# Patient Record
Sex: Female | Born: 1960
Health system: Southern US, Community
[De-identification: ages and names within clinical notes are randomized; demographics above are authoritative.]

## PROBLEM LIST (undated history)

## (undated) DIAGNOSIS — I10 Essential (primary) hypertension: Secondary | ICD-10-CM

## (undated) HISTORY — PX: BREAST CYST ASPIRATION: SHX578

---

## 2002-12-17 ENCOUNTER — Other Ambulatory Visit: Payer: Self-pay

## 2006-02-04 ENCOUNTER — Emergency Department: Payer: Self-pay | Admitting: Emergency Medicine

## 2006-02-07 ENCOUNTER — Ambulatory Visit: Payer: Self-pay | Admitting: Emergency Medicine

## 2006-10-07 ENCOUNTER — Emergency Department: Payer: Self-pay | Admitting: Emergency Medicine

## 2006-10-07 ENCOUNTER — Other Ambulatory Visit: Payer: Self-pay

## 2007-06-01 ENCOUNTER — Ambulatory Visit: Payer: Self-pay | Admitting: Family Medicine

## 2007-06-22 ENCOUNTER — Ambulatory Visit: Payer: Self-pay | Admitting: Family Medicine

## 2007-08-23 ENCOUNTER — Ambulatory Visit: Payer: Self-pay | Admitting: Family Medicine

## 2007-08-30 ENCOUNTER — Ambulatory Visit: Payer: Self-pay | Admitting: Surgery

## 2007-08-31 ENCOUNTER — Ambulatory Visit: Payer: Self-pay | Admitting: Surgery

## 2008-08-20 ENCOUNTER — Ambulatory Visit: Payer: Self-pay | Admitting: Family Medicine

## 2008-12-04 ENCOUNTER — Ambulatory Visit: Payer: Self-pay | Admitting: Family Medicine

## 2009-02-03 ENCOUNTER — Ambulatory Visit: Payer: Self-pay | Admitting: Gastroenterology

## 2009-09-01 ENCOUNTER — Ambulatory Visit: Payer: Self-pay | Admitting: Unknown Physician Specialty

## 2010-05-06 ENCOUNTER — Ambulatory Visit: Payer: Self-pay | Admitting: Obstetrics and Gynecology

## 2010-05-11 ENCOUNTER — Ambulatory Visit: Payer: Self-pay | Admitting: Obstetrics and Gynecology

## 2010-09-14 ENCOUNTER — Ambulatory Visit: Payer: Self-pay | Admitting: Unknown Physician Specialty

## 2011-11-02 ENCOUNTER — Ambulatory Visit: Payer: Self-pay | Admitting: Internal Medicine

## 2012-01-04 ENCOUNTER — Ambulatory Visit: Payer: Self-pay | Admitting: Physician Assistant

## 2012-02-07 ENCOUNTER — Ambulatory Visit: Payer: Self-pay | Admitting: Internal Medicine

## 2012-11-02 ENCOUNTER — Ambulatory Visit: Payer: Self-pay | Admitting: Internal Medicine

## 2012-11-20 ENCOUNTER — Ambulatory Visit: Payer: Self-pay | Admitting: Gastroenterology

## 2013-12-11 ENCOUNTER — Ambulatory Visit: Payer: Self-pay | Admitting: Internal Medicine

## 2013-12-19 ENCOUNTER — Ambulatory Visit: Payer: Self-pay | Admitting: Internal Medicine

## 2014-06-13 ENCOUNTER — Other Ambulatory Visit: Payer: Self-pay | Admitting: Internal Medicine

## 2014-06-13 DIAGNOSIS — R911 Solitary pulmonary nodule: Secondary | ICD-10-CM

## 2014-06-17 ENCOUNTER — Ambulatory Visit
Admission: RE | Admit: 2014-06-17 | Discharge: 2014-06-17 | Disposition: A | Discharge status: Home / Self Care | Payer: 59 | Source: Ambulatory Visit | Attending: Internal Medicine | Admitting: Internal Medicine

## 2014-06-17 DIAGNOSIS — J432 Centrilobular emphysema: Secondary | ICD-10-CM | POA: Insufficient documentation

## 2014-06-17 DIAGNOSIS — R911 Solitary pulmonary nodule: Secondary | ICD-10-CM | POA: Diagnosis present

## 2014-06-17 DIAGNOSIS — K760 Fatty (change of) liver, not elsewhere classified: Secondary | ICD-10-CM | POA: Insufficient documentation

## 2014-06-17 DIAGNOSIS — Z9049 Acquired absence of other specified parts of digestive tract: Secondary | ICD-10-CM | POA: Diagnosis not present

## 2014-06-17 DIAGNOSIS — I10 Essential (primary) hypertension: Secondary | ICD-10-CM | POA: Diagnosis not present

## 2014-06-17 DIAGNOSIS — K449 Diaphragmatic hernia without obstruction or gangrene: Secondary | ICD-10-CM | POA: Diagnosis not present

## 2014-06-17 HISTORY — DX: Essential (primary) hypertension: I10

## 2014-08-19 ENCOUNTER — Other Ambulatory Visit: Payer: Self-pay | Admitting: Internal Medicine

## 2014-08-19 DIAGNOSIS — R102 Pelvic and perineal pain: Secondary | ICD-10-CM

## 2014-08-19 DIAGNOSIS — R1030 Lower abdominal pain, unspecified: Secondary | ICD-10-CM

## 2014-08-22 ENCOUNTER — Ambulatory Visit
Admission: RE | Admit: 2014-08-22 | Discharge: 2014-08-22 | Disposition: A | Discharge status: Home / Self Care | Payer: 59 | Source: Ambulatory Visit | Attending: Internal Medicine | Admitting: Internal Medicine

## 2014-08-22 DIAGNOSIS — K760 Fatty (change of) liver, not elsewhere classified: Secondary | ICD-10-CM | POA: Insufficient documentation

## 2014-08-22 DIAGNOSIS — R102 Pelvic and perineal pain: Secondary | ICD-10-CM

## 2014-08-22 DIAGNOSIS — K5730 Diverticulosis of large intestine without perforation or abscess without bleeding: Secondary | ICD-10-CM | POA: Diagnosis not present

## 2014-08-22 DIAGNOSIS — N281 Cyst of kidney, acquired: Secondary | ICD-10-CM | POA: Insufficient documentation

## 2014-08-22 DIAGNOSIS — R1030 Lower abdominal pain, unspecified: Secondary | ICD-10-CM | POA: Diagnosis present

## 2014-08-22 DIAGNOSIS — N949 Unspecified condition associated with female genital organs and menstrual cycle: Secondary | ICD-10-CM | POA: Diagnosis present

## 2014-08-22 MED ORDER — IOHEXOL 300 MG/ML  SOLN
100.0000 mL | Freq: Once | INTRAMUSCULAR | Status: AC | PRN
  Administered 2014-08-22: 100 mL via INTRAVENOUS

## 2015-05-13 ENCOUNTER — Other Ambulatory Visit: Payer: Self-pay | Admitting: Internal Medicine

## 2015-05-13 DIAGNOSIS — Z1231 Encounter for screening mammogram for malignant neoplasm of breast: Secondary | ICD-10-CM

## 2015-05-15 ENCOUNTER — Ambulatory Visit: Payer: 59

## 2015-05-30 ENCOUNTER — Ambulatory Visit: Payer: 59

## 2015-06-02 ENCOUNTER — Ambulatory Visit
Admission: RE | Admit: 2015-06-02 | Discharge: 2015-06-02 | Disposition: A | Discharge status: Home / Self Care | Payer: 59 | Source: Ambulatory Visit | Attending: Internal Medicine | Admitting: Internal Medicine

## 2015-06-02 DIAGNOSIS — Z1231 Encounter for screening mammogram for malignant neoplasm of breast: Secondary | ICD-10-CM

## 2016-05-05 ENCOUNTER — Other Ambulatory Visit: Payer: Self-pay | Admitting: Internal Medicine

## 2016-05-05 DIAGNOSIS — Z1231 Encounter for screening mammogram for malignant neoplasm of breast: Secondary | ICD-10-CM

## 2016-05-18 DIAGNOSIS — R202 Paresthesia of skin: Secondary | ICD-10-CM | POA: Diagnosis not present

## 2016-05-18 DIAGNOSIS — Z1211 Encounter for screening for malignant neoplasm of colon: Secondary | ICD-10-CM | POA: Diagnosis not present

## 2016-05-18 DIAGNOSIS — Z124 Encounter for screening for malignant neoplasm of cervix: Secondary | ICD-10-CM | POA: Diagnosis not present

## 2016-05-18 DIAGNOSIS — Z1231 Encounter for screening mammogram for malignant neoplasm of breast: Secondary | ICD-10-CM | POA: Diagnosis not present

## 2016-05-18 DIAGNOSIS — E7800 Pure hypercholesterolemia, unspecified: Secondary | ICD-10-CM | POA: Diagnosis not present

## 2016-05-18 DIAGNOSIS — Z79899 Other long term (current) drug therapy: Secondary | ICD-10-CM | POA: Diagnosis not present

## 2016-05-18 DIAGNOSIS — Z01419 Encounter for gynecological examination (general) (routine) without abnormal findings: Secondary | ICD-10-CM | POA: Diagnosis not present

## 2016-05-18 DIAGNOSIS — I10 Essential (primary) hypertension: Secondary | ICD-10-CM | POA: Diagnosis not present

## 2016-06-25 ENCOUNTER — Ambulatory Visit: Payer: 59

## 2016-06-25 ENCOUNTER — Ambulatory Visit
Admission: RE | Admit: 2016-06-25 | Discharge: 2016-06-25 | Disposition: A | Discharge status: Home / Self Care | Payer: 59 | Source: Ambulatory Visit | Attending: Internal Medicine | Admitting: Internal Medicine

## 2016-06-25 DIAGNOSIS — Z1231 Encounter for screening mammogram for malignant neoplasm of breast: Secondary | ICD-10-CM | POA: Diagnosis not present

## 2017-02-04 DIAGNOSIS — N762 Acute vulvitis: Secondary | ICD-10-CM | POA: Diagnosis not present

## 2017-02-18 DIAGNOSIS — L280 Lichen simplex chronicus: Secondary | ICD-10-CM | POA: Diagnosis not present

## 2017-02-18 DIAGNOSIS — L821 Other seborrheic keratosis: Secondary | ICD-10-CM | POA: Diagnosis not present

## 2017-04-29 DIAGNOSIS — E7800 Pure hypercholesterolemia, unspecified: Secondary | ICD-10-CM | POA: Diagnosis not present

## 2017-04-29 DIAGNOSIS — I10 Essential (primary) hypertension: Secondary | ICD-10-CM | POA: Diagnosis not present

## 2017-06-10 DIAGNOSIS — Z79899 Other long term (current) drug therapy: Secondary | ICD-10-CM | POA: Diagnosis not present

## 2017-06-10 DIAGNOSIS — R7309 Other abnormal glucose: Secondary | ICD-10-CM | POA: Diagnosis not present

## 2017-06-10 DIAGNOSIS — I10 Essential (primary) hypertension: Secondary | ICD-10-CM | POA: Diagnosis not present

## 2017-06-10 DIAGNOSIS — E7800 Pure hypercholesterolemia, unspecified: Secondary | ICD-10-CM | POA: Diagnosis not present

## 2017-06-24 ENCOUNTER — Other Ambulatory Visit: Payer: Self-pay | Admitting: Internal Medicine

## 2017-06-24 DIAGNOSIS — Z1231 Encounter for screening mammogram for malignant neoplasm of breast: Secondary | ICD-10-CM

## 2017-07-26 ENCOUNTER — Ambulatory Visit
Admission: RE | Admit: 2017-07-26 | Discharge: 2017-07-26 | Disposition: A | Discharge status: Home / Self Care | Payer: 59 | Source: Ambulatory Visit | Attending: Internal Medicine | Admitting: Internal Medicine

## 2017-07-26 DIAGNOSIS — Z1231 Encounter for screening mammogram for malignant neoplasm of breast: Secondary | ICD-10-CM | POA: Diagnosis not present

## 2017-07-26 DIAGNOSIS — Z01419 Encounter for gynecological examination (general) (routine) without abnormal findings: Secondary | ICD-10-CM | POA: Diagnosis not present

## 2017-08-19 DIAGNOSIS — Z124 Encounter for screening for malignant neoplasm of cervix: Secondary | ICD-10-CM | POA: Diagnosis not present

## 2017-09-19 DIAGNOSIS — I10 Essential (primary) hypertension: Secondary | ICD-10-CM | POA: Diagnosis not present

## 2017-09-19 DIAGNOSIS — E049 Nontoxic goiter, unspecified: Secondary | ICD-10-CM | POA: Diagnosis not present

## 2017-09-19 DIAGNOSIS — Z0000 Encounter for general adult medical examination without abnormal findings: Secondary | ICD-10-CM | POA: Diagnosis not present

## 2017-09-30 DIAGNOSIS — M778 Other enthesopathies, not elsewhere classified: Secondary | ICD-10-CM | POA: Diagnosis not present

## 2017-09-30 DIAGNOSIS — M7712 Lateral epicondylitis, left elbow: Secondary | ICD-10-CM | POA: Diagnosis not present

## 2017-09-30 DIAGNOSIS — M25522 Pain in left elbow: Secondary | ICD-10-CM | POA: Diagnosis not present

## 2018-02-02 DIAGNOSIS — H9209 Otalgia, unspecified ear: Secondary | ICD-10-CM | POA: Diagnosis not present

## 2018-02-02 DIAGNOSIS — G501 Atypical facial pain: Secondary | ICD-10-CM | POA: Diagnosis not present

## 2018-03-15 DIAGNOSIS — Z79899 Other long term (current) drug therapy: Secondary | ICD-10-CM | POA: Diagnosis not present

## 2018-03-15 DIAGNOSIS — E7800 Pure hypercholesterolemia, unspecified: Secondary | ICD-10-CM | POA: Diagnosis not present

## 2018-03-15 DIAGNOSIS — R7309 Other abnormal glucose: Secondary | ICD-10-CM | POA: Diagnosis not present

## 2018-03-15 DIAGNOSIS — I10 Essential (primary) hypertension: Secondary | ICD-10-CM | POA: Diagnosis not present

## 2018-03-21 DIAGNOSIS — I10 Essential (primary) hypertension: Secondary | ICD-10-CM | POA: Diagnosis not present

## 2018-03-21 DIAGNOSIS — M2550 Pain in unspecified joint: Secondary | ICD-10-CM | POA: Diagnosis not present

## 2018-03-21 DIAGNOSIS — E7800 Pure hypercholesterolemia, unspecified: Secondary | ICD-10-CM | POA: Diagnosis not present

## 2018-07-20 ENCOUNTER — Other Ambulatory Visit: Payer: Self-pay | Admitting: Internal Medicine

## 2018-07-20 DIAGNOSIS — Z1231 Encounter for screening mammogram for malignant neoplasm of breast: Secondary | ICD-10-CM

## 2018-08-28 ENCOUNTER — Ambulatory Visit
Admission: RE | Admit: 2018-08-28 | Discharge: 2018-08-28 | Disposition: A | Discharge status: Home / Self Care | Payer: BC Managed Care – PPO | Source: Ambulatory Visit | Attending: Internal Medicine | Admitting: Internal Medicine

## 2018-08-28 DIAGNOSIS — Z1231 Encounter for screening mammogram for malignant neoplasm of breast: Secondary | ICD-10-CM | POA: Insufficient documentation

## 2018-08-31 ENCOUNTER — Other Ambulatory Visit: Payer: Self-pay | Admitting: Internal Medicine

## 2018-08-31 DIAGNOSIS — N6310 Unspecified lump in the right breast, unspecified quadrant: Secondary | ICD-10-CM

## 2018-08-31 DIAGNOSIS — R928 Other abnormal and inconclusive findings on diagnostic imaging of breast: Secondary | ICD-10-CM

## 2018-09-08 ENCOUNTER — Ambulatory Visit
Admission: RE | Admit: 2018-09-08 | Discharge: 2018-09-08 | Disposition: A | Discharge status: Home / Self Care | Payer: BC Managed Care – PPO | Source: Ambulatory Visit | Attending: Internal Medicine | Admitting: Internal Medicine

## 2018-09-08 ENCOUNTER — Other Ambulatory Visit: Payer: Self-pay

## 2018-09-08 DIAGNOSIS — N6310 Unspecified lump in the right breast, unspecified quadrant: Secondary | ICD-10-CM | POA: Insufficient documentation

## 2018-09-08 DIAGNOSIS — R928 Other abnormal and inconclusive findings on diagnostic imaging of breast: Secondary | ICD-10-CM | POA: Diagnosis not present

## 2019-02-23 ENCOUNTER — Other Ambulatory Visit: Payer: Self-pay | Admitting: Internal Medicine

## 2019-02-23 DIAGNOSIS — N6001 Solitary cyst of right breast: Secondary | ICD-10-CM

## 2019-03-15 ENCOUNTER — Ambulatory Visit
Admission: RE | Admit: 2019-03-15 | Discharge: 2019-03-15 | Disposition: A | Discharge status: Home / Self Care | Payer: BC Managed Care – PPO | Source: Ambulatory Visit | Attending: Internal Medicine | Admitting: Internal Medicine

## 2019-03-15 DIAGNOSIS — N6001 Solitary cyst of right breast: Secondary | ICD-10-CM | POA: Diagnosis not present

## 2019-03-16 ENCOUNTER — Other Ambulatory Visit: Payer: Self-pay | Admitting: Internal Medicine

## 2019-03-16 DIAGNOSIS — Z1231 Encounter for screening mammogram for malignant neoplasm of breast: Secondary | ICD-10-CM

## 2019-03-16 DIAGNOSIS — R928 Other abnormal and inconclusive findings on diagnostic imaging of breast: Secondary | ICD-10-CM

## 2019-03-16 DIAGNOSIS — N63 Unspecified lump in unspecified breast: Secondary | ICD-10-CM

## 2019-08-29 ENCOUNTER — Other Ambulatory Visit: Payer: BC Managed Care – PPO

## 2019-08-31 ENCOUNTER — Other Ambulatory Visit: Payer: Self-pay

## 2019-08-31 ENCOUNTER — Ambulatory Visit
Admission: RE | Admit: 2019-08-31 | Discharge: 2019-08-31 | Disposition: A | Discharge status: Home / Self Care | Payer: 59 | Source: Ambulatory Visit | Attending: Internal Medicine | Admitting: Internal Medicine

## 2019-08-31 DIAGNOSIS — N63 Unspecified lump in unspecified breast: Secondary | ICD-10-CM | POA: Diagnosis present

## 2019-08-31 DIAGNOSIS — Z1231 Encounter for screening mammogram for malignant neoplasm of breast: Secondary | ICD-10-CM

## 2019-08-31 DIAGNOSIS — R928 Other abnormal and inconclusive findings on diagnostic imaging of breast: Secondary | ICD-10-CM | POA: Diagnosis not present

## 2020-01-18 ENCOUNTER — Other Ambulatory Visit: Payer: Self-pay

## 2020-01-18 ENCOUNTER — Ambulatory Visit (LOCAL_COMMUNITY_HEALTH_CENTER): Payer: 59

## 2020-01-18 DIAGNOSIS — Z23 Encounter for immunization: Secondary | ICD-10-CM

## 2020-01-18 NOTE — Progress Notes (Signed)
Tdap given; tolerated well.Tressia Labrum, RN  

## 2020-06-05 ENCOUNTER — Other Ambulatory Visit: Payer: Self-pay | Admitting: Internal Medicine

## 2020-06-05 DIAGNOSIS — Z1231 Encounter for screening mammogram for malignant neoplasm of breast: Secondary | ICD-10-CM

## 2020-09-05 ENCOUNTER — Ambulatory Visit
Admission: RE | Admit: 2020-09-05 | Discharge: 2020-09-05 | Disposition: A | Discharge status: Home / Self Care | Payer: 59 | Source: Ambulatory Visit | Attending: Internal Medicine | Admitting: Internal Medicine

## 2020-09-05 ENCOUNTER — Other Ambulatory Visit: Payer: Self-pay

## 2020-09-05 DIAGNOSIS — Z1231 Encounter for screening mammogram for malignant neoplasm of breast: Secondary | ICD-10-CM | POA: Diagnosis present

## 2020-09-17 IMAGING — US US BREAST*R* LIMITED INC AXILLA
1 series · 11 of 11 positions shown · non-contrast
Comparison: Previous exam(s).

CLINICAL DATA: Six-month follow-up for a likely benign right breast
mass.

EXAM:
ULTRASOUND OF THE RIGHT BREAST

[Series 1: us breast*right* limited inc axilla · 0.05mm/px · 11 of 11 slices shown]
[im 1/11]
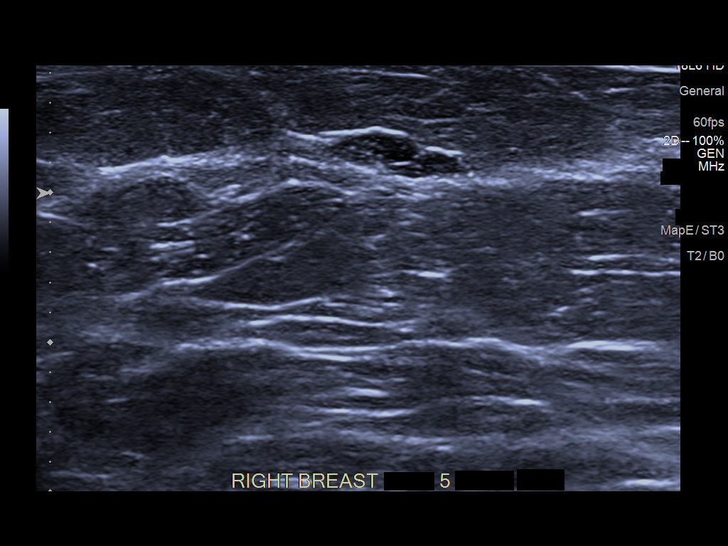
[im 2/11]
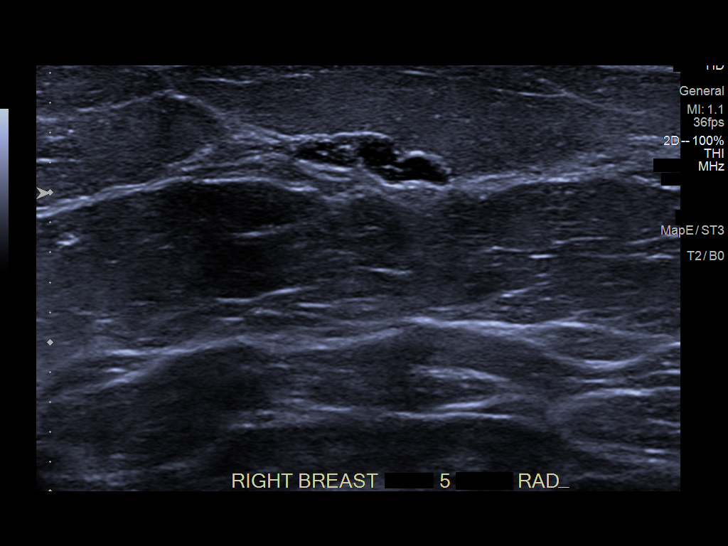
[im 3/11]
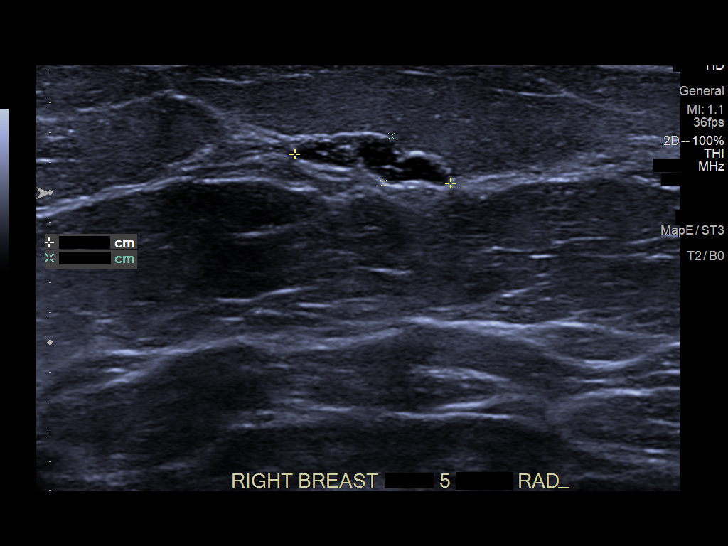
[im 4/11]
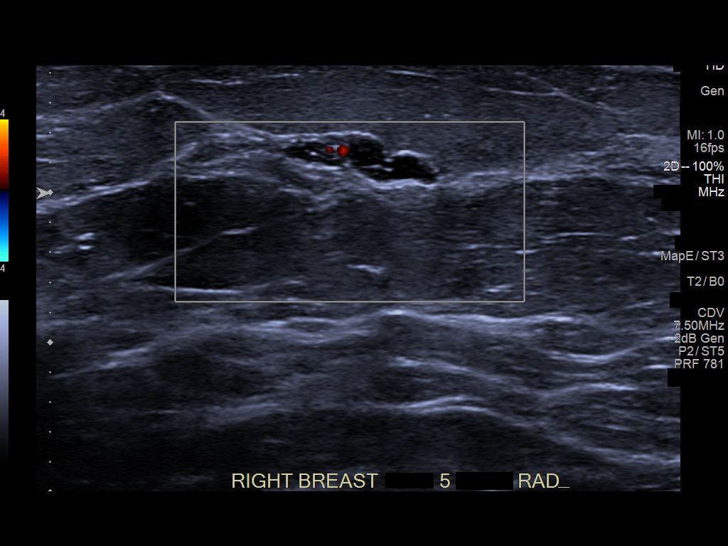
[im 5/11]
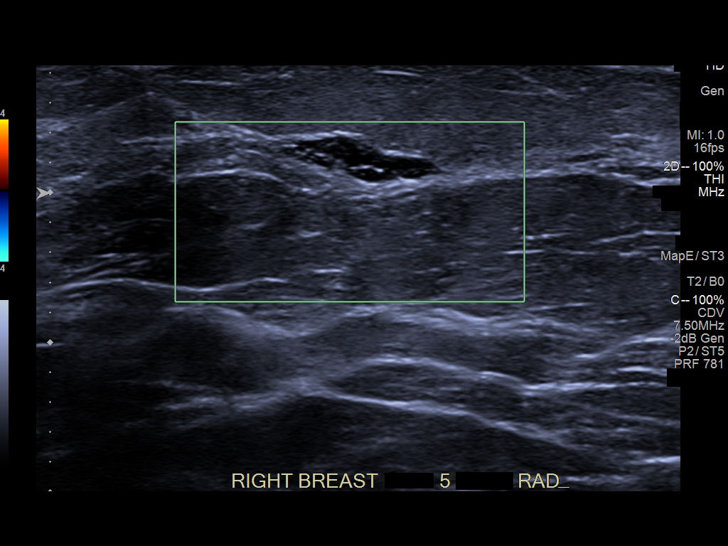
[im 6/11]
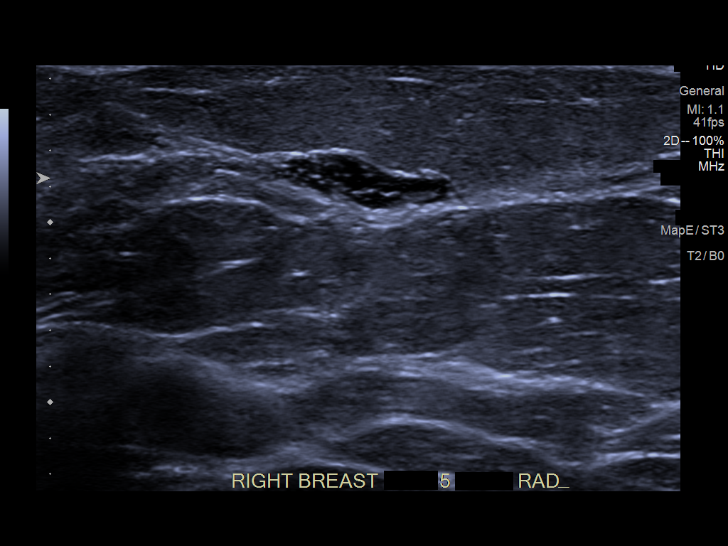
[im 7/11]
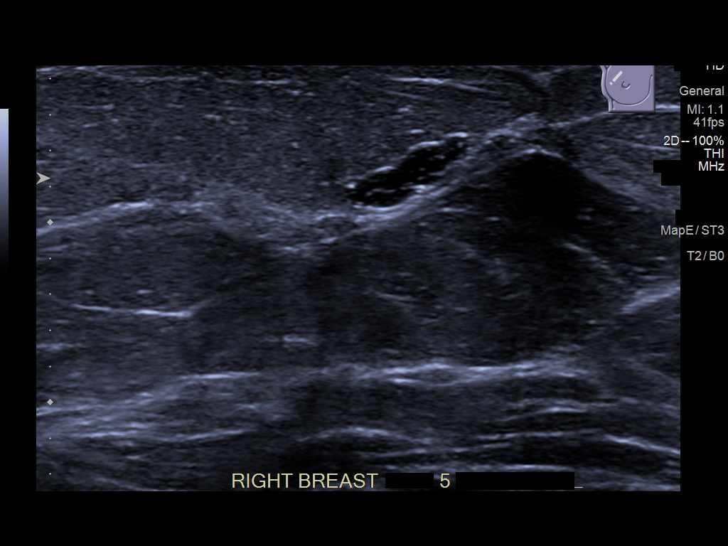
[im 8/11]
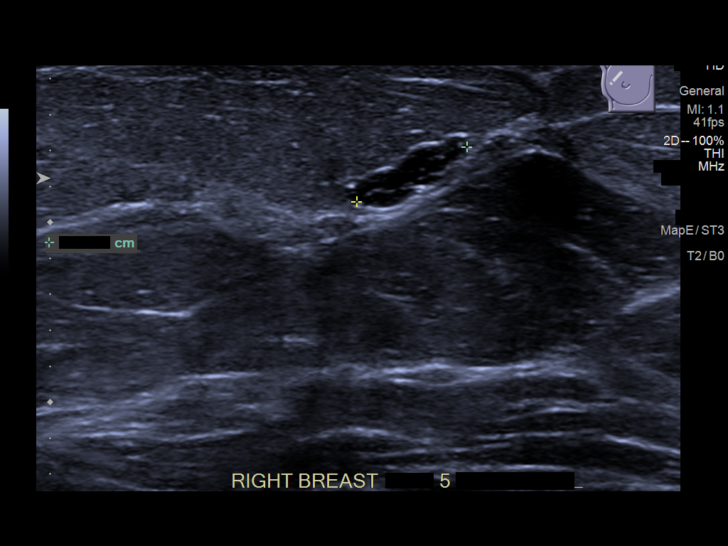
[im 9/11]
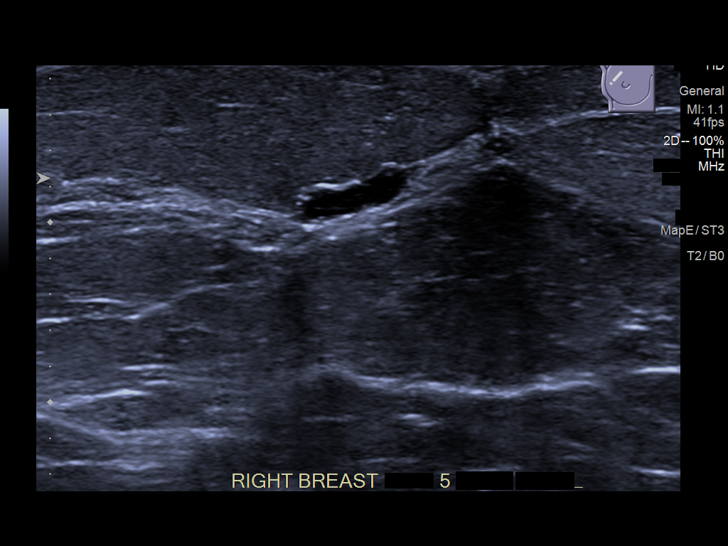
[im 10/11]
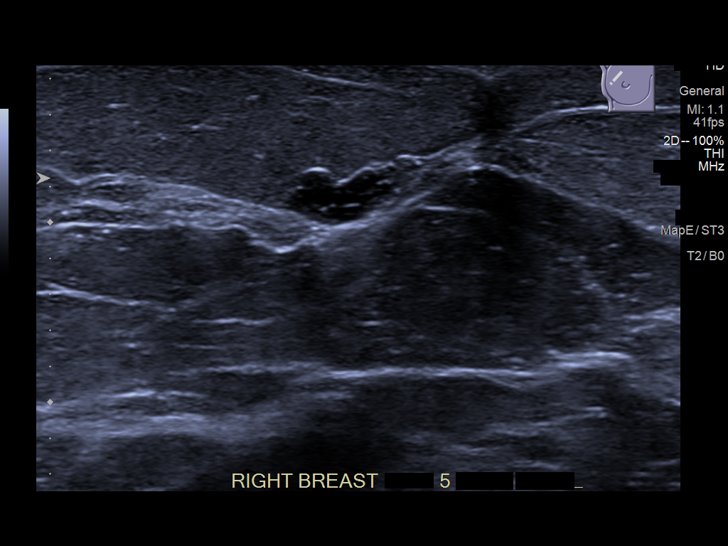
[im 11/11]
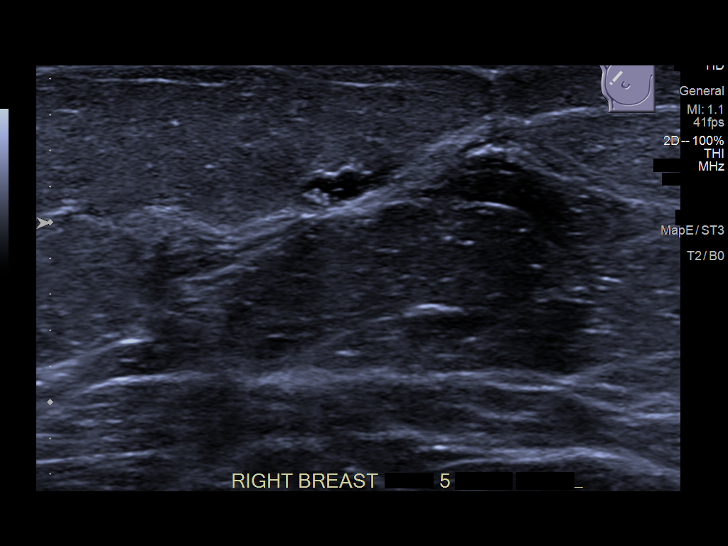

[11 of 11 positions shown; findings below may reference images not displayed]

FINDINGS: Ultrasound of the right breast at 10 30, 5 cm from the nipple
demonstrates a stable oval mass measuring 1.1 x 0.3 x 0.7 cm,
previously 1.2 x 0.3 x 0.7 cm.
IMPRESSION: The likely benign mass in the right breast at [DATE] is stable.

RECOMMENDATION:
Bilateral diagnostic mammogram and right breast ultrasound is
recommended in Friday August, 2019.

I have discussed the findings and recommendations with the patient.
If applicable, a reminder letter will be sent to the patient
regarding the next appointment.

BI-RADS CATEGORY  3: Probably benign.

## 2020-11-04 ENCOUNTER — Other Ambulatory Visit: Payer: Self-pay | Admitting: Gastroenterology

## 2020-11-04 ENCOUNTER — Other Ambulatory Visit (HOSPITAL_COMMUNITY): Payer: Self-pay | Admitting: Gastroenterology

## 2020-11-04 DIAGNOSIS — R1013 Epigastric pain: Secondary | ICD-10-CM

## 2020-11-13 ENCOUNTER — Other Ambulatory Visit: Payer: Self-pay

## 2020-11-13 ENCOUNTER — Ambulatory Visit
Admission: RE | Admit: 2020-11-13 | Discharge: 2020-11-13 | Disposition: A | Discharge status: Home / Self Care | Payer: 59 | Source: Ambulatory Visit | Attending: Gastroenterology | Admitting: Gastroenterology

## 2020-11-13 DIAGNOSIS — R1013 Epigastric pain: Secondary | ICD-10-CM | POA: Insufficient documentation

## 2021-06-23 ENCOUNTER — Other Ambulatory Visit: Payer: Self-pay | Admitting: Internal Medicine

## 2021-06-23 DIAGNOSIS — Z1231 Encounter for screening mammogram for malignant neoplasm of breast: Secondary | ICD-10-CM

## 2021-09-11 ENCOUNTER — Ambulatory Visit
Admission: RE | Admit: 2021-09-11 | Discharge: 2021-09-11 | Disposition: A | Discharge status: Home / Self Care | Payer: 59 | Source: Ambulatory Visit | Attending: Internal Medicine | Admitting: Internal Medicine

## 2021-09-11 DIAGNOSIS — Z1231 Encounter for screening mammogram for malignant neoplasm of breast: Secondary | ICD-10-CM | POA: Insufficient documentation

## 2021-09-15 ENCOUNTER — Other Ambulatory Visit: Payer: Self-pay | Admitting: Internal Medicine

## 2021-09-15 DIAGNOSIS — N63 Unspecified lump in unspecified breast: Secondary | ICD-10-CM

## 2021-09-15 DIAGNOSIS — R928 Other abnormal and inconclusive findings on diagnostic imaging of breast: Secondary | ICD-10-CM

## 2021-10-13 ENCOUNTER — Other Ambulatory Visit: Payer: 59

## 2021-10-14 ENCOUNTER — Ambulatory Visit
Admission: RE | Admit: 2021-10-14 | Discharge: 2021-10-14 | Disposition: A | Discharge status: Home / Self Care | Payer: 59 | Source: Ambulatory Visit | Attending: Internal Medicine | Admitting: Internal Medicine

## 2021-10-14 DIAGNOSIS — N63 Unspecified lump in unspecified breast: Secondary | ICD-10-CM | POA: Insufficient documentation

## 2021-10-14 DIAGNOSIS — R928 Other abnormal and inconclusive findings on diagnostic imaging of breast: Secondary | ICD-10-CM | POA: Insufficient documentation

## 2021-12-10 ENCOUNTER — Telehealth: Payer: Self-pay

## 2021-12-10 ENCOUNTER — Other Ambulatory Visit: Payer: Self-pay

## 2021-12-10 ENCOUNTER — Other Ambulatory Visit
Admission: RE | Admit: 2021-12-10 | Discharge: 2021-12-10 | Disposition: A | Discharge status: Home / Self Care | Payer: 59 | Attending: Cardiology | Admitting: Cardiology

## 2021-12-10 ENCOUNTER — Encounter: Payer: Self-pay | Admitting: Cardiology

## 2021-12-10 ENCOUNTER — Ambulatory Visit: Payer: 59 | Attending: Cardiology | Admitting: Cardiology

## 2021-12-10 VITALS — BP 128/68 | HR 77 | Ht 63.0 in | Wt 181.0 lb

## 2021-12-10 DIAGNOSIS — I10 Essential (primary) hypertension: Secondary | ICD-10-CM

## 2021-12-10 DIAGNOSIS — R209 Unspecified disturbances of skin sensation: Secondary | ICD-10-CM | POA: Diagnosis not present

## 2021-12-10 DIAGNOSIS — R072 Precordial pain: Secondary | ICD-10-CM | POA: Diagnosis present

## 2021-12-10 DIAGNOSIS — E876 Hypokalemia: Secondary | ICD-10-CM

## 2021-12-10 LAB — BASIC METABOLIC PANEL
Anion gap: 9 (ref 5–15)
BUN: 14 mg/dL (ref 8–23)
CO2: 29 mmol/L (ref 22–32)
Calcium: 10.1 mg/dL (ref 8.9–10.3)
Chloride: 103 mmol/L (ref 98–111)
Creatinine, Ser: 1.03 mg/dL — ABNORMAL HIGH (ref 0.44–1.00)
GFR, Estimated: >60 mL/min (ref >60)
Glucose, Bld: 109 mg/dL — ABNORMAL HIGH (ref 70–99)
Potassium: 3.2 mmol/L — ABNORMAL LOW (ref 3.5–5.1)
Sodium: 141 mmol/L (ref 135–145)

## 2021-12-10 MED ORDER — POTASSIUM CHLORIDE CRYS ER 20 MEQ PO TBCR: 20.0000 meq | EXTENDED_RELEASE_TABLET | Freq: Every day | ORAL | 3 refills | Status: AC

## 2021-12-10 MED ORDER — IVABRADINE HCL 5 MG PO TABS: 10.0000 mg | ORAL_TABLET | Freq: Once | ORAL | 0 refills | Status: AC

## 2021-12-10 MED ORDER — METOPROLOL TARTRATE 100 MG PO TABS: 100.0000 mg | ORAL_TABLET | Freq: Once | ORAL | 0 refills | Status: DC

## 2021-12-10 NOTE — Telephone Encounter (Signed)
-----   Message from Debbe Odea, MD sent at 12/10/2021  1:40 PM EST ----- Potassium slightly low.  This is likely from hydrochlorothiazide use.  Recommend decreasing Dyazide to half a tab daily.  Repeat BMP in 1 week.

## 2021-12-10 NOTE — Telephone Encounter (Signed)
Patients Triamterene-Hydrochlorothiazide is in capsule form. It does not come in a lower dose. Discussed with Dr. Azucena Cecil, he advised that patient start taking Potassium 20 MEQ once a day and then get a repeat BMP in 1 week.    Called patient and she was grateful for the follow up.

## 2021-12-10 NOTE — Progress Notes (Signed)
Cardiology Office Note:    Date:  12/10/2021   ID:  AADVIKA KONEN, DOB 06/16/1960, MRN [id number]  PCP:  Marguarite Arbour, MD   [facility] Providers Cardiologist:  Debbe Odea, MD     Referring MD: Marguarite Arbour, MD   Chief Complaint  Patient presents with   New Patient (Initial Visit)    Self Ref, Tingling in chest, No Family Hx    History of Present Illness:    Stephanie Howard is a 61 y.o. female with a hx of hypertension, former smoker x 30+ years who presents due to chest pain.  Complains of chest discomfort/pressure ongoing over the past several months.  Symptoms are not associated with exertion.  Due to risk factors, patient wanted to make sure everything is okay.  Also complains of coldness in her legs especially when she goes to sleep at night, ongoing for about a year.  Denies any personal history of heart disease.  Past Medical History:  Diagnosis Date   Hypertension     Past Surgical History:  Procedure Laterality Date   BREAST CYST ASPIRATION Left     Current Medications: Current Meds  Medication Sig   allopurinol (ZYLOPRIM) 100 MG tablet Take 200 mg by mouth daily.   fluticasone (FLONASE) 50 MCG/ACT nasal spray Place 2 sprays into both nostrils daily.   ivabradine (CORLANOR) 5 MG TABS tablet Take 2 tablets (10 mg total) by mouth once for 1 dose. 2 hours prior to your CT Scan.   metoprolol tartrate (LOPRESSOR) 100 MG tablet Take 1 tablet (100 mg total) by mouth once for 1 dose. Take 2 hours prior to your CT scan.   omeprazole (PRILOSEC) 40 MG capsule Take 40 mg by mouth daily.   triamterene-hydrochlorothiazide (DYAZIDE) 37.5-25 MG capsule Take 1 capsule by mouth daily.   Vitamin D, Ergocalciferol, (DRISDOL) 1.25 MG (50000 UNIT) CAPS capsule Take 50,000 Units by mouth every 7 (seven) days.     Allergies:   Hydrocodone and Losartan   Social History   Socioeconomic History   Marital status: Divorced    Spouse name: Not on file    Number of children: Not on file   Years of education: Not on file   Highest education level: Not on file  Occupational History   Not on file  Tobacco Use   Smoking status: Former    Packs/day: 1.00    Types: Cigarettes   Smokeless tobacco: Never  Substance and Sexual Activity   Alcohol use: Yes    Comment: occasionally   Drug use: Never   Sexual activity: Not on file  Other Topics Concern   Not on file  Social History Narrative   Not on file   Social Determinants of Health   Financial Resource Strain: Not on file  Food Insecurity: Not on file  Transportation Needs: Not on file  Physical Activity: Not on file  Stress: Not on file  Social Connections: Not on file     Family History: The patient's family history includes Ovarian cancer (age of onset: 32) in her mother. There is no history of Breast cancer.  ROS:   Please see the history of present illness.     All other systems reviewed and are negative.  EKGs/Labs/Other Studies Reviewed:    The following studies were reviewed today:   EKG:  EKG is  ordered today.  The ekg ordered today demonstrates normal sinus rhythm, normal ECG  Recent Labs: No results found for requested  labs within last 365 days.  Recent Lipid Panel No results found for: "CHOL", "TRIG", "HDL", "CHOLHDL", "VLDL", "LDLCALC", "LDLDIRECT"   Risk Assessment/Calculations:             Physical Exam:    VS:  BP 128/68 (BP Location: Right Arm, Patient Position: Sitting)   Pulse 77   Ht 5\' 3"  (1.6 m)   Wt 181 lb (82.1 kg)   SpO2 94%   BMI 32.06 kg/m     Wt Readings from Last 3 Encounters:  12/10/21 181 lb (82.1 kg)     GEN:  Well nourished, well developed in no acute distress HEENT: Normal NECK: No JVD; No carotid bruits CARDIAC: RRR, no murmurs, rubs, gallops RESPIRATORY:  Clear to auscultation without rales, wheezing or rhonchi  ABDOMEN: Soft, non-tender, non-distended MUSCULOSKELETAL:  No edema; No deformity  SKIN: Warm and  dry NEUROLOGIC:  Alert and oriented x 3 PSYCHIATRIC:  Normal affect   ASSESSMENT:    1. Precordial pain   2. Primary hypertension   3. Sensation of cold in leg    PLAN:    In order of problems listed above:  Chest pain, risk factors hypertension, former smoker.  Get echocardiogram, get coronary CTA. Hypertension, BP controlled.  Continue HCTZ, triamterene Cold sensation in legs, former smoker.  Screen for PAD with ABI.    Follow-up after echo and coronary CTA      Medication Adjustments/Labs and Tests Ordered: Current medicines are reviewed at length with the patient today.  Concerns regarding medicines are outlined above.  Orders Placed This Encounter  Procedures   CT CORONARY MORPH W/CTA COR W/SCORE W/CA W/CM &/OR WO/CM   Basic metabolic panel   EKG 12-Lead   ECHOCARDIOGRAM COMPLETE   VAS 13/09/23 LOWER EXTREMITY ARTERIAL DUPLEX   VAS [place] ABI WITH/WO TBI   Meds ordered this encounter  Medications   metoprolol tartrate (LOPRESSOR) 100 MG tablet    Sig: Take 1 tablet (100 mg total) by mouth once for 1 dose. Take 2 hours prior to your CT scan.    Dispense:  1 tablet    Refill:  0   ivabradine (CORLANOR) 5 MG TABS tablet    Sig: Take 2 tablets (10 mg total) by mouth once for 1 dose. 2 hours prior to your CT Scan.    Dispense:  2 tablet    Refill:  0    Patient Instructions  Medication Instructions:   Your physician recommends that you continue on your current medications as directed. Please refer to the Current Medication list given to you today.  *If you need a refill on your cardiac medications before your next appointment, please call your pharmacy*   Lab Work:  Please go to the [facility] after your appointment today for a BMP Lab draw.   Testing/Procedures:  Your physician has requested that you have a lower extremity arterial exercise duplex. During this test, exercise and ultrasound are used to evaluate arterial blood flow in the legs. Allow one hour for  this exam. There are no restrictions or special instructions.   Your physician has requested that you have an ankle brachial index (ABI). During this test an ultrasound and blood pressure cuff are used to evaluate the arteries that supply the arms and legs with blood. Allow thirty minutes for this exam. There are no restrictions or special instructions.   Your physician has requested that you have an echocardiogram. Echocardiography is a painless test that uses sound waves to create images  of your heart. It provides your doctor with information about the size and shape of your heart and how well your heart's chambers and valves are working. This procedure takes approximately one hour. There are no restrictions for this procedure. Please do NOT wear cologne, perfume, aftershave, or lotions (deodorant is allowed). Please arrive 15 minutes prior to your appointment time.   Your physician has requested that you have cardiac CT. Cardiac computed tomography (CT) is a painless test that uses an x-ray machine to take clear, detailed pictures of your heart.    Your cardiac CT will be scheduled at:   [facility] [address] [place], [place] [address] [phone]  Monday 12/21/21  at 1:00 PM  Please arrive 15 mins early for check-in and test prep.    Please follow these instructions carefully (unless otherwise directed):   Night Before the Test: Be sure to Drink plenty of water. Do not consume any caffeinated/decaffeinated beverages or chocolate 12 hours prior to your test.   On the Day of the Test: Drink plenty of water until 1 hour prior to the test. Do not eat any food 4 hours prior to the test. You may take your regular medications prior to the test.  Take metoprolol 100 MG (Lopressor) two hours prior to test. Take Ivabradine (Corlanor) 10 MG two hours prior to test. Hold your triamterene-hydrochlorothiazide the morning of your test. FEMALES- please wear  underwire-free bra if available, avoid dresses & tight clothing   After the Test: Drink plenty of water. After receiving IV contrast, you may experience a mild flushed feeling. This is normal. On occasion, you may experience a mild rash up to 24 hours after the test. This is not dangerous. If this occurs, you can take Benadryl 25 mg and increase your fluid intake. If you experience trouble breathing, this can be serious. If it is severe call 911 IMMEDIATELY. If it is mild, please call our office. If you take any of these medications: Glipizide/Metformin, Avandament, Glucavance, please do not take 48 hours after completing test unless otherwise instructed.  Please allow 2-4 weeks for scheduling of routine cardiac CTs. Some insurance companies require a pre-authorization which may delay scheduling of this test.   For non-scheduling related questions, please contact the cardiac imaging nurse navigator should you have any questions/concerns: Rockwell Alexandria, Cardiac Imaging Nurse Navigator Larey Brick, Cardiac Imaging Nurse Navigator [facility] Direct Office Dial: [phone]   For scheduling needs, including cancellations and rescheduling, please call [place], [phone].     Follow-Up: At [facility], you and your health needs are our priority.  As part of our continuing mission to provide you with exceptional heart care, we have created designated Provider Care Teams.  These Care Teams include your primary Cardiologist (physician) and Advanced Practice Providers (APPs -  Physician Assistants and Nurse Practitioners) who all work together to provide you with the care you need, when you need it.  We recommend signing up for the patient portal called "MyChart".  Sign up information is provided on this After Visit Summary.  MyChart is used to connect with patients for Virtual Visits (Telemedicine).  Patients are able to view lab/test results, encounter  notes, upcoming appointments, etc.  Non-urgent messages can be sent to your provider as well.   To learn more about what you can do with MyChart, go to [email or web address].    Your next appointment:   Follow up after testing   The format  for your next appointment:   In Person  Provider:   You may see Debbe Odea, MD or one of the following Advanced Practice Providers on your designated Care Team:   Nicolasa Ducking, NP Eula Listen, PA-C Cadence Fransico Michael, PA-C Charlsie Quest, NP    Other Instructions   Important Information About Sugar         Signed, Debbe Odea, MD  12/10/2021 10:50 AM    [facility]

## 2021-12-10 NOTE — Patient Instructions (Signed)
Medication Instructions:   Your physician recommends that you continue on your current medications as directed. Please refer to the Current Medication list given to you today.  *If you need a refill on your cardiac medications before your next appointment, please call your pharmacy*   Lab Work:  Please go to the [facility] [place] after your appointment today for a BMP Lab draw.   Testing/Procedures:  Your physician has requested that you have a lower extremity arterial exercise duplex. During this test, exercise and ultrasound are used to evaluate arterial blood flow in the legs. Allow one hour for this exam. There are no restrictions or special instructions.   Your physician has requested that you have an ankle brachial index (ABI). During this test an ultrasound and blood pressure cuff are used to evaluate the arteries that supply the arms and legs with blood. Allow thirty minutes for this exam. There are no restrictions or special instructions.   Your physician has requested that you have an echocardiogram. Echocardiography is a painless test that uses sound waves to create images of your heart. It provides your doctor with information about the size and shape of your heart and how well your heart's chambers and valves are working. This procedure takes approximately one hour. There are no restrictions for this procedure. Please do NOT wear cologne, perfume, aftershave, or lotions (deodorant is allowed). Please arrive 15 minutes prior to your appointment time.   Your physician has requested that you have cardiac CT. Cardiac computed tomography (CT) is a painless test that uses an x-ray machine to take clear, detailed pictures of your heart.    Your cardiac CT will be scheduled at:   [facility] [address] [place], [place] [address] [phone]  Monday 12/21/21  at 1:00 PM  Please arrive 15 mins early for check-in and test prep.    Please follow these  instructions carefully (unless otherwise directed):   Night Before the Test: Be sure to Drink plenty of water. Do not consume any caffeinated/decaffeinated beverages or chocolate 12 hours prior to your test.   On the Day of the Test: Drink plenty of water until 1 hour prior to the test. Do not eat any food 4 hours prior to the test. You may take your regular medications prior to the test.  Take metoprolol 100 MG (Lopressor) two hours prior to test. Take Ivabradine (Corlanor) 10 MG two hours prior to test. Hold your triamterene-hydrochlorothiazide the morning of your test. FEMALES- please wear underwire-free bra if available, avoid dresses & tight clothing   After the Test: Drink plenty of water. After receiving IV contrast, you may experience a mild flushed feeling. This is normal. On occasion, you may experience a mild rash up to 24 hours after the test. This is not dangerous. If this occurs, you can take Benadryl 25 mg and increase your fluid intake. If you experience trouble breathing, this can be serious. If it is severe call 911 IMMEDIATELY. If it is mild, please call our office. If you take any of these medications: Glipizide/Metformin, Avandament, Glucavance, please do not take 48 hours after completing test unless otherwise instructed.  Please allow 2-4 weeks for scheduling of routine cardiac CTs. Some insurance companies require a pre-authorization which may delay scheduling of this test.   For non-scheduling related questions, please contact the cardiac imaging nurse navigator should you have any questions/concerns: Rockwell Alexandria, Cardiac Imaging Nurse Navigator Larey Brick, Cardiac Imaging Nurse Navigator [facility] Direct Office Dial: [phone]   For scheduling needs, including cancellations and rescheduling, please call [place], [phone].     Follow-Up: At [facility], you and your health needs are our priority.  As  part of our continuing mission to provide you with exceptional heart care, we have created designated Provider Care Teams.  These Care Teams include your primary Cardiologist (physician) and Advanced Practice Providers (APPs -  Physician Assistants and Nurse Practitioners) who all work together to provide you with the care you need, when you need it.  We recommend signing up for the patient portal called "MyChart".  Sign up information is provided on this After Visit Summary.  MyChart is used to connect with patients for Virtual Visits (Telemedicine).  Patients are able to view lab/test results, encounter notes, upcoming appointments, etc.  Non-urgent messages can be sent to your provider as well.   To learn more about what you can do with MyChart, go to [email or web address].    Your next appointment:   Follow up after testing   The format for your next appointment:   In Person  Provider:   You may see Debbe Odea, MD or one of the following Advanced Practice Providers on your designated Care Team:   Nicolasa Ducking, NP Eula Listen, PA-C Cadence Fransico Michael, PA-C Charlsie Quest, NP    Other Instructions   Important Information About Sugar

## 2021-12-17 ENCOUNTER — Telehealth (HOSPITAL_COMMUNITY): Payer: Self-pay | Admitting: Emergency Medicine

## 2021-12-17 NOTE — Telephone Encounter (Signed)
Reaching out to patient to offer assistance regarding upcoming cardiac imaging study; pt verbalizes understanding of appt date/time, parking situation and where to check in, pre-test NPO status and medications ordered, and verified current allergies; name and call back number provided for further questions should they arise Martavion Couper RN Navigator Cardiac Imaging [facility] [phone] office [phone] cell 

## 2021-12-18 ENCOUNTER — Other Ambulatory Visit
Admission: RE | Admit: 2021-12-18 | Discharge: 2021-12-18 | Disposition: A | Discharge status: Home / Self Care | Payer: 59 | Attending: Cardiology | Admitting: Cardiology

## 2021-12-18 DIAGNOSIS — E876 Hypokalemia: Secondary | ICD-10-CM | POA: Insufficient documentation

## 2021-12-18 LAB — BASIC METABOLIC PANEL
Anion gap: 9 (ref 5–15)
BUN: 14 mg/dL (ref 8–23)
CO2: 27 mmol/L (ref 22–32)
Calcium: 9.8 mg/dL (ref 8.9–10.3)
Chloride: 105 mmol/L (ref 98–111)
Creatinine, Ser: 0.99 mg/dL (ref 0.44–1.00)
GFR, Estimated: >60 mL/min (ref >60)
Glucose, Bld: 95 mg/dL (ref 70–99)
Potassium: 3.5 mmol/L (ref 3.5–5.1)
Sodium: 141 mmol/L (ref 135–145)

## 2021-12-21 ENCOUNTER — Ambulatory Visit
Admission: RE | Admit: 2021-12-21 | Discharge: 2021-12-21 | Disposition: A | Discharge status: Home / Self Care | Payer: 59 | Source: Ambulatory Visit | Attending: Cardiology | Admitting: Cardiology

## 2021-12-21 DIAGNOSIS — R072 Precordial pain: Secondary | ICD-10-CM | POA: Insufficient documentation

## 2021-12-21 MED ORDER — IOHEXOL 350 MG/ML SOLN
100.0000 mL | Freq: Once | INTRAVENOUS | Status: AC | PRN
  Administered 2021-12-21: 100 mL via INTRAVENOUS

## 2021-12-21 MED ORDER — NITROGLYCERIN 0.4 MG SL SUBL
0.8000 mg | SUBLINGUAL_TABLET | Freq: Once | SUBLINGUAL | Status: AC
  Administered 2021-12-21: 0.8 mg via SUBLINGUAL

## 2021-12-21 MED ORDER — METOPROLOL TARTRATE 5 MG/5ML IV SOLN
10.0000 mg | Freq: Once | INTRAVENOUS | Status: AC
  Administered 2021-12-21: 5 mg via INTRAVENOUS

## 2021-12-21 MED ORDER — METOPROLOL TARTRATE 5 MG/5ML IV SOLN
INTRAVENOUS | Status: AC
  Filled 2021-12-21: qty 10

## 2021-12-21 NOTE — Progress Notes (Signed)
Patient tolerated procedure well. Ambulate w/o difficulty. Denies any lightheadedness or being dizzy. Pt denies any pain at this time. Sitting in chair, pt is encouraged to drink additional water throughout the day and reason explained to patient. Patient verbalized understanding and all questions answered. ABC intact. No further needs at this time. Discharge from procedure area w/o issues.  

## 2022-02-09 ENCOUNTER — Ambulatory Visit (INDEPENDENT_AMBULATORY_CARE_PROVIDER_SITE_OTHER): Payer: 59

## 2022-02-09 ENCOUNTER — Ambulatory Visit: Payer: 59 | Attending: Cardiology

## 2022-02-09 DIAGNOSIS — R209 Unspecified disturbances of skin sensation: Secondary | ICD-10-CM | POA: Diagnosis not present

## 2022-02-09 DIAGNOSIS — R072 Precordial pain: Secondary | ICD-10-CM

## 2022-02-09 LAB — ECHOCARDIOGRAM COMPLETE
AR max vel: 2.39 cm2
AV Area VTI: 2.48 cm2
AV Area mean vel: 2.05 cm2
AV Mean grad: 3 mmHg
AV Peak grad: 4.8 mmHg
Ao pk vel: 1.09 m/s
Area-P 1/2: 2.8 cm2

## 2022-02-09 LAB — VAS US LOWER EXT ART SEG MULTI (SEGMENTALS & LE RAYNAUDS)
Left ABI: 1.1
Right ABI: 1.05

## 2022-02-09 MED ORDER — PERFLUTREN LIPID MICROSPHERE
1.0000 mL | INTRAVENOUS | Status: AC | PRN
  Administered 2022-02-09: 2 mL via INTRAVENOUS

## 2022-02-11 ENCOUNTER — Telehealth: Payer: Self-pay

## 2022-02-11 NOTE — Telephone Encounter (Signed)
error 

## 2022-02-12 ENCOUNTER — Ambulatory Visit: Payer: 59 | Attending: Cardiology | Admitting: Cardiology

## 2022-02-12 ENCOUNTER — Encounter: Payer: Self-pay | Admitting: Cardiology

## 2022-02-12 VITALS — BP 126/80 | HR 77 | Ht 63.0 in | Wt 183.2 lb

## 2022-02-12 DIAGNOSIS — I10 Essential (primary) hypertension: Secondary | ICD-10-CM | POA: Diagnosis not present

## 2022-02-12 DIAGNOSIS — R072 Precordial pain: Secondary | ICD-10-CM | POA: Diagnosis not present

## 2022-02-12 DIAGNOSIS — R209 Unspecified disturbances of skin sensation: Secondary | ICD-10-CM | POA: Diagnosis not present

## 2022-02-12 NOTE — Patient Instructions (Signed)
Medication Instructions:   Your physician recommends that you continue on your current medications as directed. Please refer to the Current Medication list given to you today.  *If you need a refill on your cardiac medications before your next appointment, please call your pharmacy*   Lab Work:  None Ordered  If you have labs (blood work) drawn today and your tests are completely normal, you will receive your results only by: MyChart Message (if you have MyChart) OR A paper copy in the mail If you have any lab test that is abnormal or we need to change your treatment, we will call you to review the results.   Testing/Procedures:  None Ordered   Follow-Up: At [facility], you and your health needs are our priority.  As part of our continuing mission to provide you with exceptional heart care, we have created designated Provider Care Teams.  These Care Teams include your primary Cardiologist (physician) and Advanced Practice Providers (APPs -  Physician Assistants and Nurse Practitioners) who all work together to provide you with the care you need, when you need it.  We recommend signing up for the patient portal called "MyChart".  Sign up information is provided on this After Visit Summary.  MyChart is used to connect with patients for Virtual Visits (Telemedicine).  Patients are able to view lab/test results, encounter notes, upcoming appointments, etc.  Non-urgent messages can be sent to your provider as well.   To learn more about what you can do with MyChart, go to [email or web address].    Your next appointment:    AS NEEDED  

## 2022-02-12 NOTE — Progress Notes (Signed)
Cardiology Office Note:    Date:  02/12/2022   ID:  Stephanie Howard, DOB [birth year], MRN [id number]  PCP:  Idelle Crouch, MD   [facility] Cardiologist:  Kate Sable, MD     Referring MD: Idelle Crouch, MD   Chief Complaint  Patient presents with   Results    Follow up, no new cardiac concerns    History of Present Illness:    Stephanie Howard is a 62 y.o. female with a hx of hypertension, former smoker x 30+ years who presents for follow-up.  Previously seen due to chest pain and leg coolness.  Due to smoking history, echo, coronary CTA was obtained to evaluate any cardiac dysfunction.  Ankle-brachial index was ordered.  Overall symptoms of chest pains have improved.  Feels well, has no concerns at this time.    Past Medical History:  Diagnosis Date   Hypertension     Past Surgical History:  Procedure Laterality Date   BREAST CYST ASPIRATION Left     Current Medications: Current Meds  Medication Sig   allopurinol (ZYLOPRIM) 100 MG tablet Take 200 mg by mouth daily.   fluticasone (FLONASE) 50 MCG/ACT nasal spray Place 2 sprays into both nostrils daily.   omeprazole (PRILOSEC) 40 MG capsule Take 40 mg by mouth daily.   potassium chloride SA (KLOR-CON M) 20 MEQ tablet Take 1 tablet (20 mEq total) by mouth daily.   triamterene-hydrochlorothiazide (DYAZIDE) 37.5-25 MG capsule Take 1 capsule by mouth daily.   Vitamin D, Ergocalciferol, (DRISDOL) 1.25 MG (50000 UNIT) CAPS capsule Take 50,000 Units by mouth every 7 (seven) days.     Allergies:   Hydrocodone and Losartan   Social History   Socioeconomic History   Marital status: Divorced    Spouse name: Not on file   Number of children: Not on file   Years of education: Not on file   Highest education level: Not on file  Occupational History   Not on file  Tobacco Use   Smoking status: Former    Packs/day: 1.00    Types: Cigarettes   Smokeless tobacco: Never  Substance and Sexual  Activity   Alcohol use: Yes    Comment: occasionally   Drug use: Never   Sexual activity: Not on file  Other Topics Concern   Not on file  Social History Narrative   Not on file   Social Determinants of Health   Financial Resource Strain: Not on file  Food Insecurity: Not on file  Transportation Needs: Not on file  Physical Activity: Not on file  Stress: Not on file  Social Connections: Not on file     Family History: The patient's family history includes Ovarian cancer (age of onset: 31) in her mother. There is no history of Breast cancer.  ROS:   Please see the history of present illness.     All other systems reviewed and are negative.  EKGs/Labs/Other Studies Reviewed:    The following studies were reviewed today:   EKG:  EKG not  ordered today.    Recent Labs: 12/18/2021: BUN 14; Creatinine, Ser 0.99; Potassium 3.5; Sodium 141  Recent Lipid Panel No results found for: "CHOL", "TRIG", "HDL", "CHOLHDL", "VLDL", "LDLCALC", "LDLDIRECT"   Risk Assessment/Calculations:             Physical Exam:    VS:  BP 126/80 (BP Location: Left Arm, Patient Position: Sitting, Cuff Size: Normal)   Pulse 77   Ht 5'  3" (1.6 m)   Wt 183 lb 3.2 oz (83.1 kg)   SpO2 100%   BMI 32.45 kg/m     Wt Readings from Last 3 Encounters:  02/12/22 183 lb 3.2 oz (83.1 kg)  12/10/21 181 lb (82.1 kg)     GEN:  Well nourished, well developed in no acute distress HEENT: Normal NECK: No JVD; No carotid bruits CARDIAC: RRR, no murmurs, rubs, gallops RESPIRATORY:  Clear to auscultation without rales, wheezing or rhonchi  ABDOMEN: Soft, non-tender, non-distended MUSCULOSKELETAL:  No edema; No deformity  SKIN: Warm and dry NEUROLOGIC:  Alert and oriented x 3 PSYCHIATRIC:  Normal affect   ASSESSMENT:    1. Precordial pain   2. Primary hypertension   3. Sensation of cold in leg     PLAN:    In order of problems listed above:  Chest pain, coronary CT with calcium score 0, no  evidence of CAD.  Echo with normal EF, 60 to 65%.  No findings to suggest etiology of chest pain.  Patient made aware of results, reassured. Hypertension, BP controlled.  Continue HCTZ, triamterene Cold sensation in legs, former smoker.  Normal ankle-brachial index.  Left toe brachial index abnormal, likely false positive.  ABI was normal, no indication for additional testing.  Follow-up as needed.      Medication Adjustments/Labs and Tests Ordered: Current medicines are reviewed at length with the patient today.  Concerns regarding medicines are outlined above.  No orders of the defined types were placed in this encounter.  No orders of the defined types were placed in this encounter.   Patient Instructions  Medication Instructions:   Your physician recommends that you continue on your current medications as directed. Please refer to the Current Medication list given to you today.   *If you need a refill on your cardiac medications before your next appointment, please call your pharmacy*   Lab Work:  None Ordered  If you have labs (blood work) drawn today and your tests are completely normal, you will receive your results only by: [place] (if you have MyChart) OR A paper copy in the mail If you have any lab test that is abnormal or we need to change your treatment, we will call you to review the results.   Testing/Procedures:  None Ordered   Follow-Up: At [facility], you and your health needs are our priority.  As part of our continuing mission to provide you with exceptional heart care, we have created designated Provider Care Teams.  These Care Teams include your primary Cardiologist (physician) and Advanced Practice Providers (APPs -  Physician Assistants and Nurse Practitioners) who all work together to provide you with the care you need, when you need it.  We recommend signing up for the patient portal called "MyChart".  Sign up information is  provided on this After Visit Summary.  MyChart is used to connect with patients for Virtual Visits (Telemedicine).  Patients are able to view lab/test results, encounter notes, upcoming appointments, etc.  Non-urgent messages can be sent to your provider as well.   To learn more about what you can do with MyChart, go to [email or web address].    Your next appointment:    AS NEEDED   Signed, Kate Sable, MD  02/12/2022 9:31 AM    [place]

## 2022-03-11 IMAGING — MG MM DIGITAL SCREENING BILAT W/ TOMO AND CAD
8 series · 8 of 24 positions shown · non-contrast
Comparison: Previous exam(s).

CLINICAL DATA: Screening.

EXAM:
DIGITAL SCREENING BILATERAL MAMMOGRAM WITH TOMOSYNTHESIS AND CAD
TECHNIQUE: Bilateral screening digital craniocaudal and mediolateral oblique
mammograms were obtained. Bilateral screening digital breast
tomosynthesis was performed. The images were evaluated with
computer-aided detection.

[L CC synth-2D]
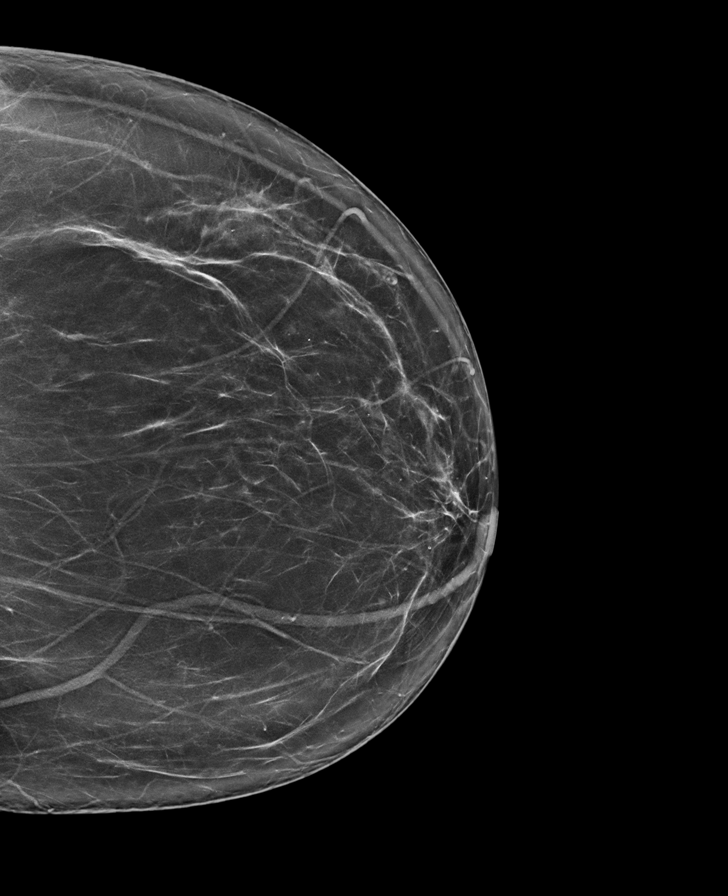

[L MLO synth-2D]
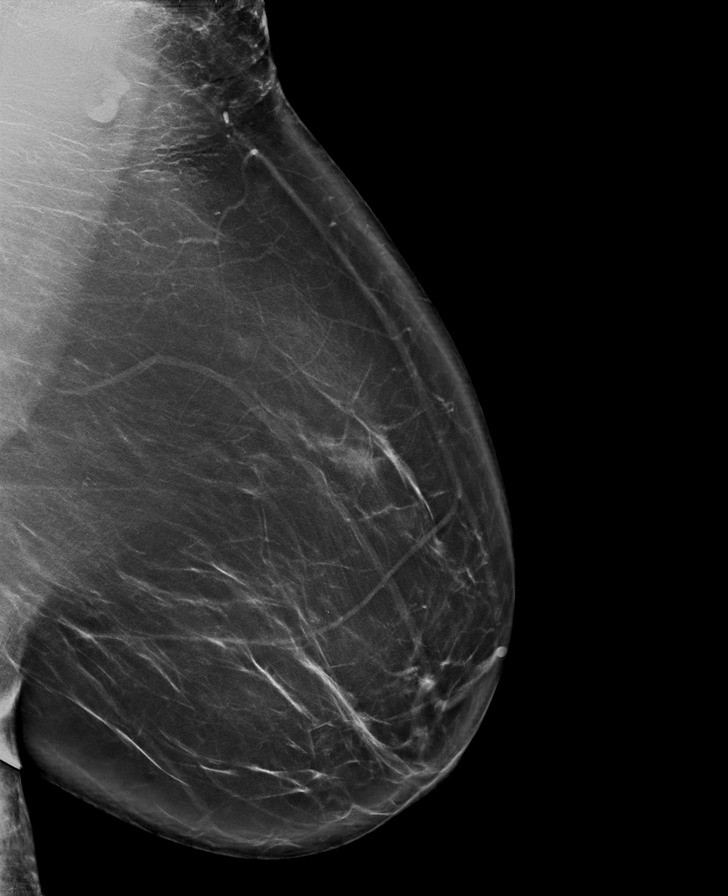

[R CC synth-2D]
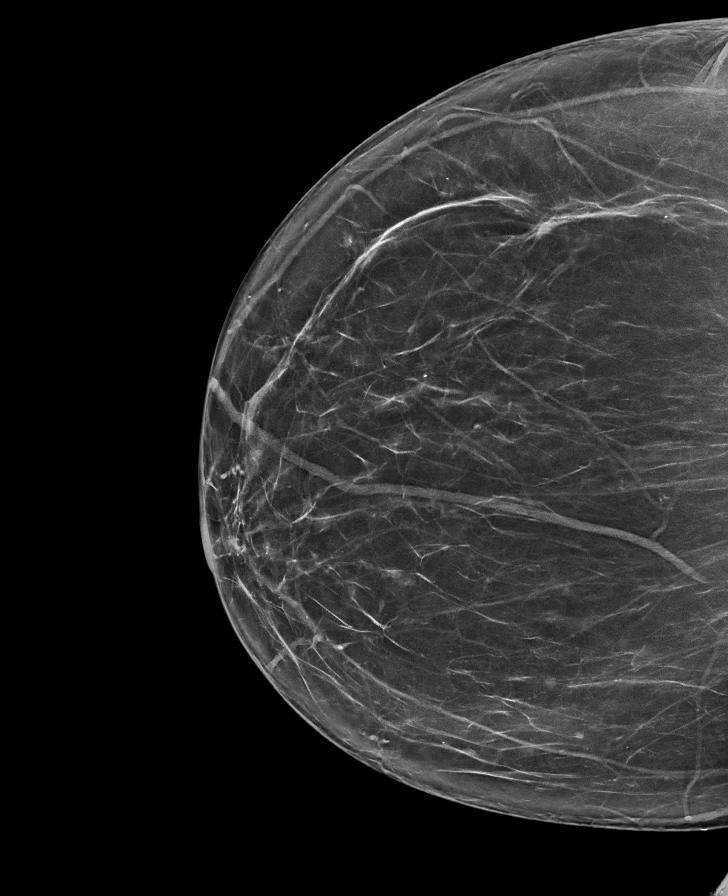

[R MLO synth-2D]
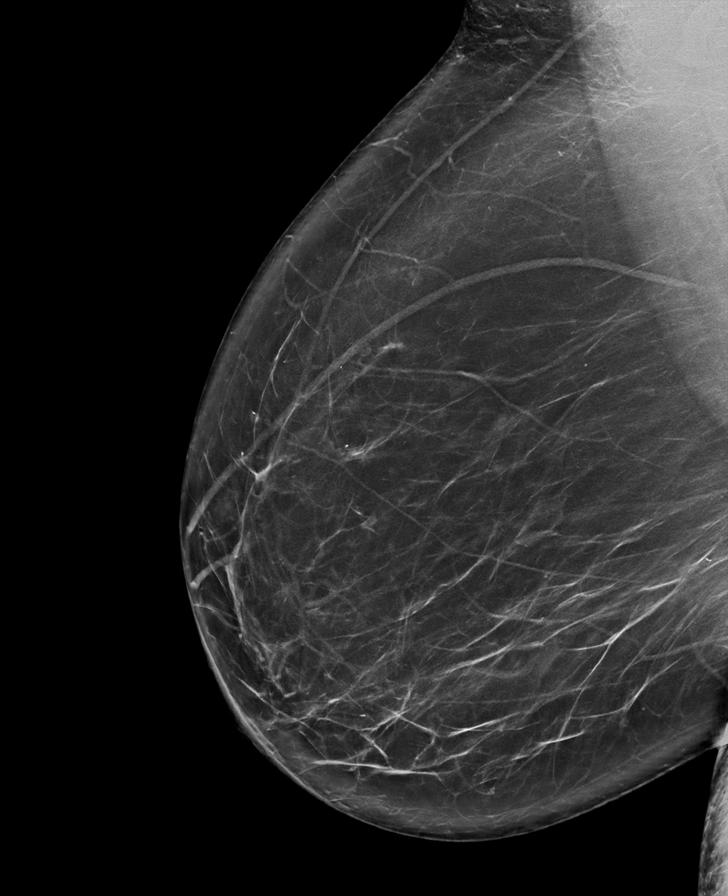

[R MLO tomo · tomo slice 47/92.0]
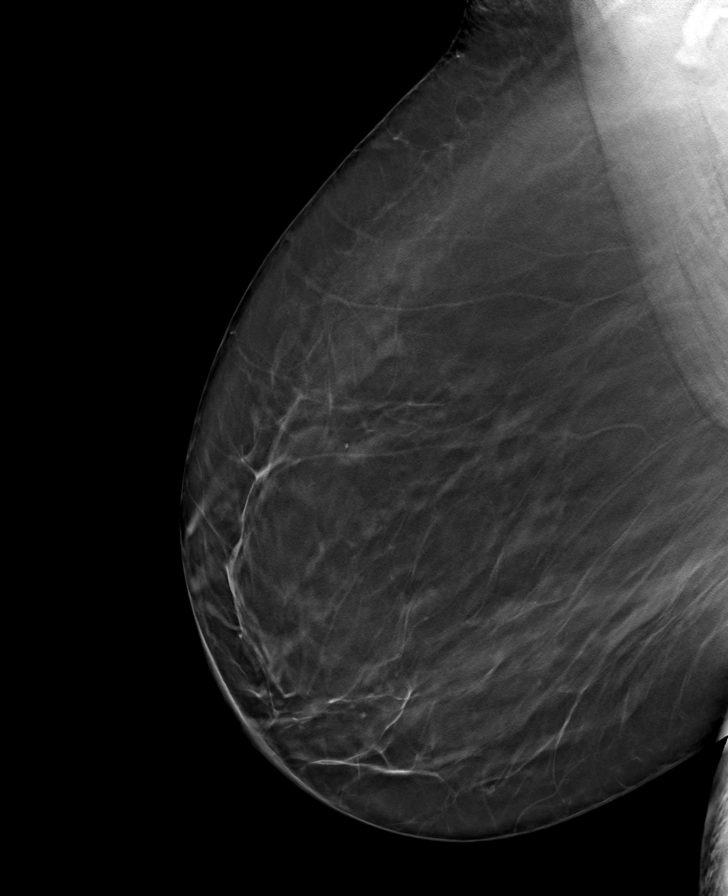

[L CC tomo · tomo slice 41/80.0]
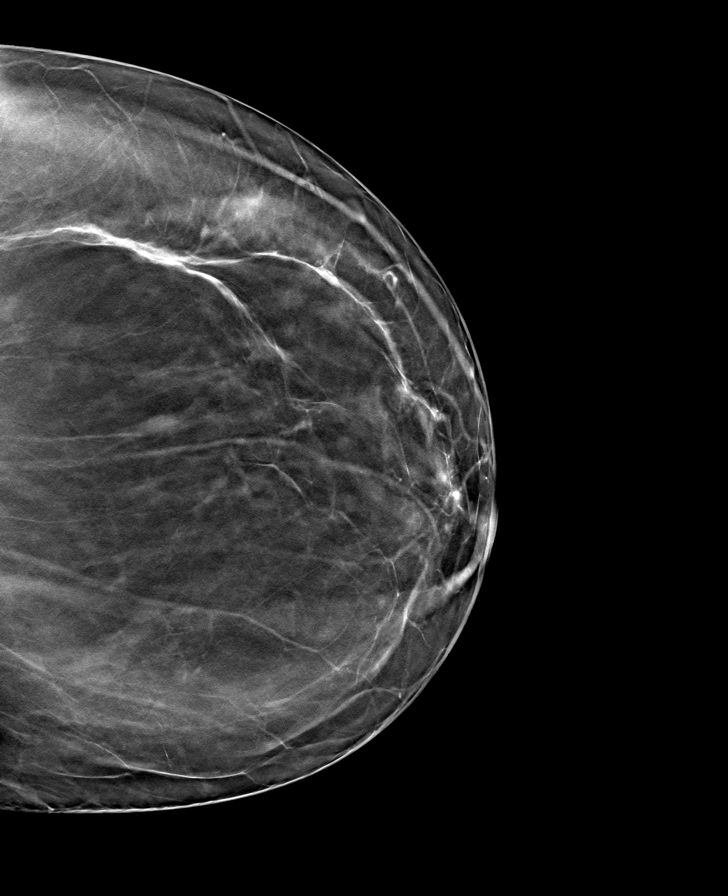

[L MLO tomo · tomo slice 51/102.0]
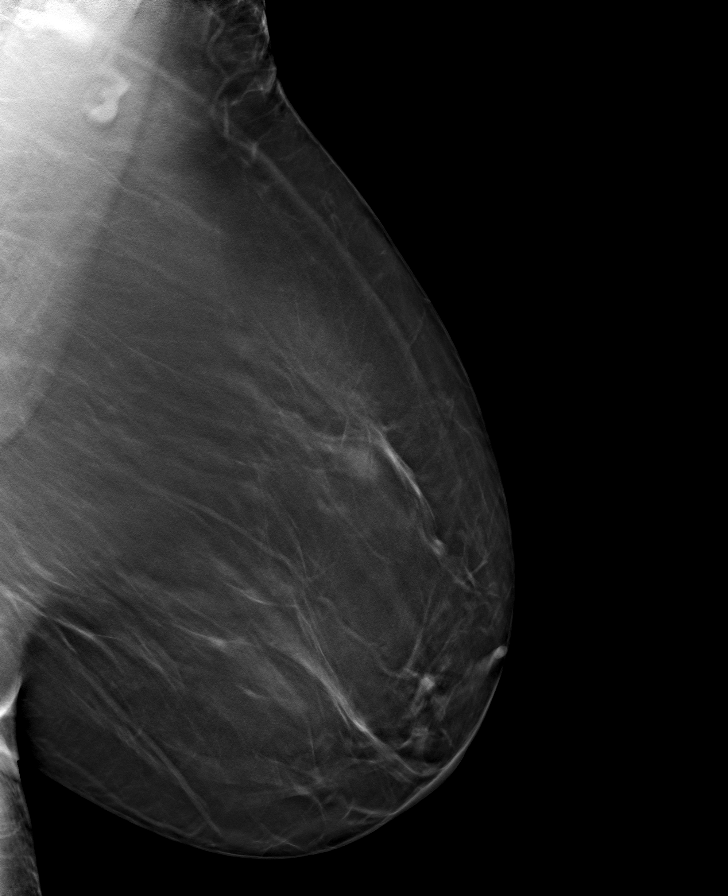

[R CC tomo · tomo slice 39/77.0]
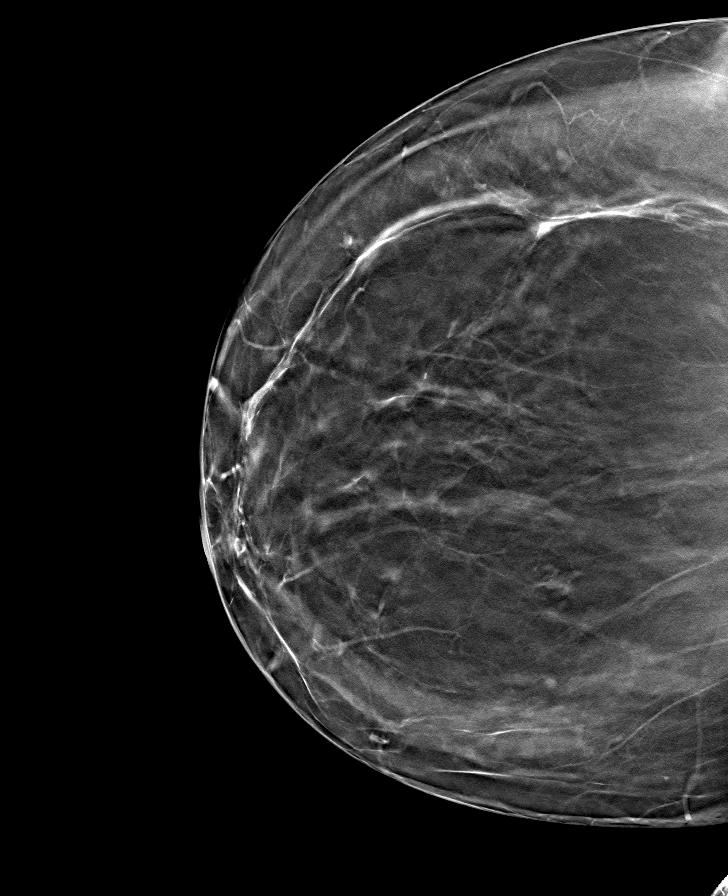

[8 of 24 positions shown; findings below may reference images not displayed]

ACR Breast Density Category b: There are scattered areas of
fibroglandular density.
FINDINGS: There are no findings suspicious for malignancy.
IMPRESSION: No mammographic evidence of malignancy. A result letter of this
screening mammogram will be mailed directly to the patient.

RECOMMENDATION:
Screening mammogram in one year. (Code:51-O-LD2)

BI-RADS CATEGORY  1: Negative.

## 2022-05-19 IMAGING — US US ABDOMEN COMPLETE
1 series · 14 of 25 positions shown · non-contrast
Comparison: CT abdomen pelvis 08/22/2014

CLINICAL DATA: Abdominal bloating and discomfort for several
months. History of cholecystectomy.

EXAM:
ABDOMEN ULTRASOUND COMPLETE

[Series 1: us abdomen complete · 0.26mm/px · 14 of 105 slices shown]
[im 1/105]
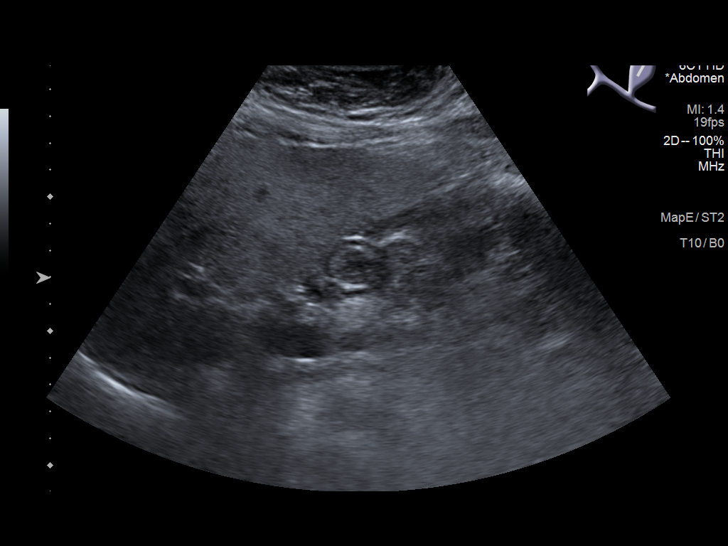
[im 9/105]
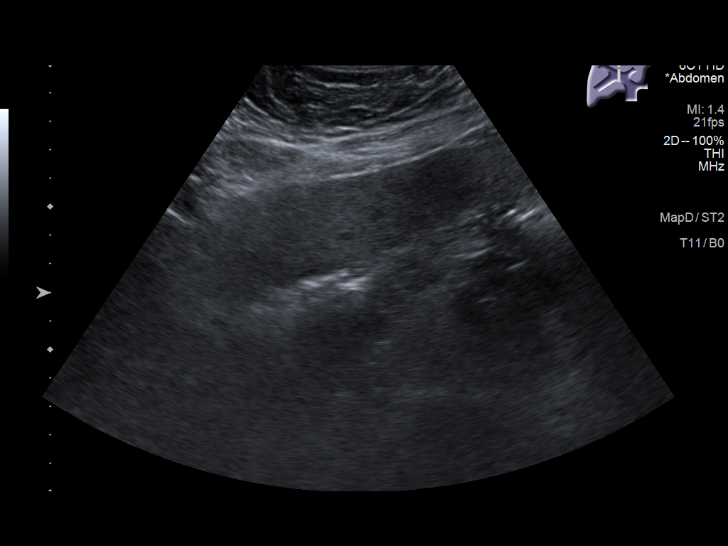
[im 18/105]
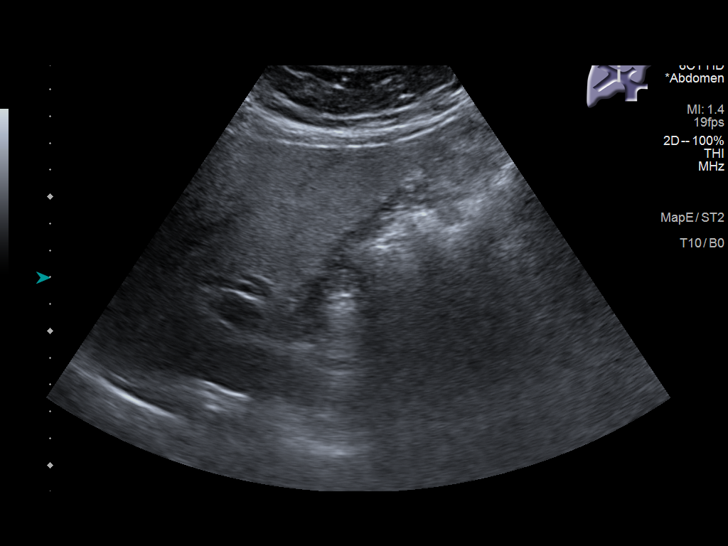
[im 27/105]
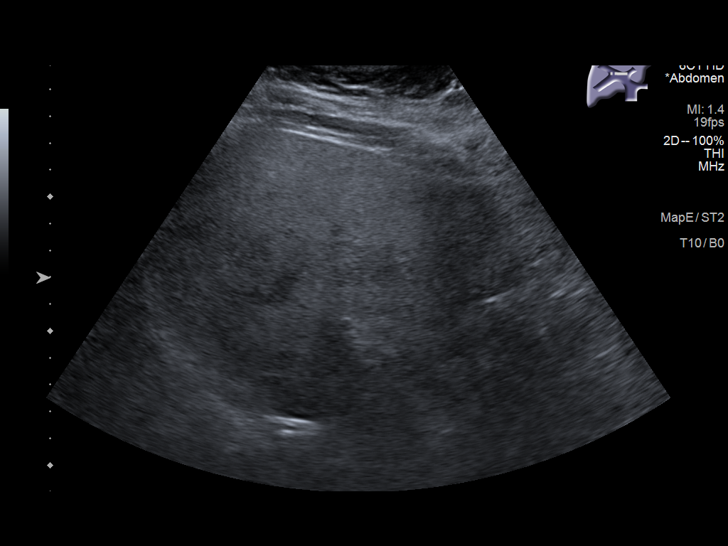
[im 35/105]
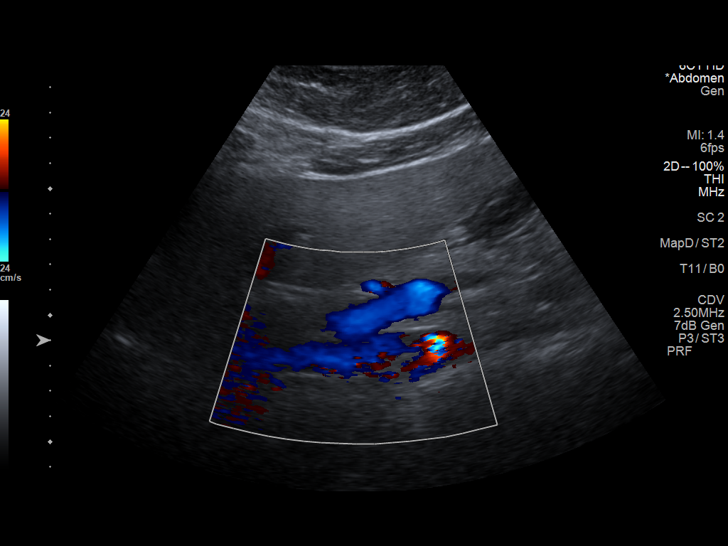
[im 40/105]
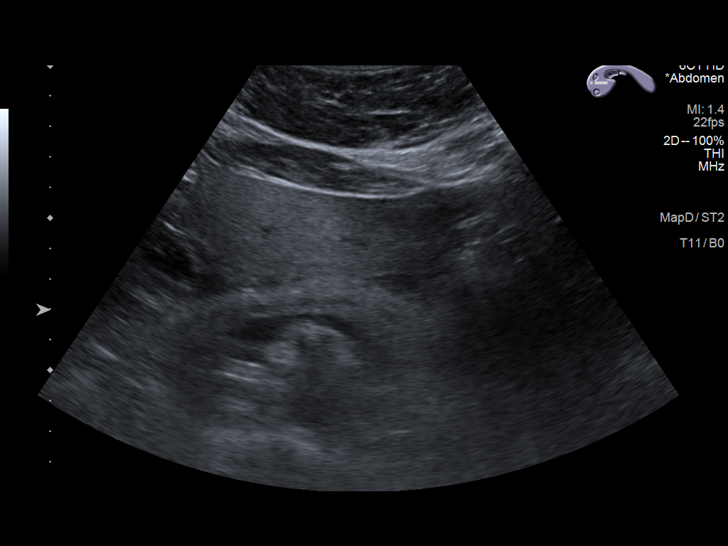
[im 48/105]
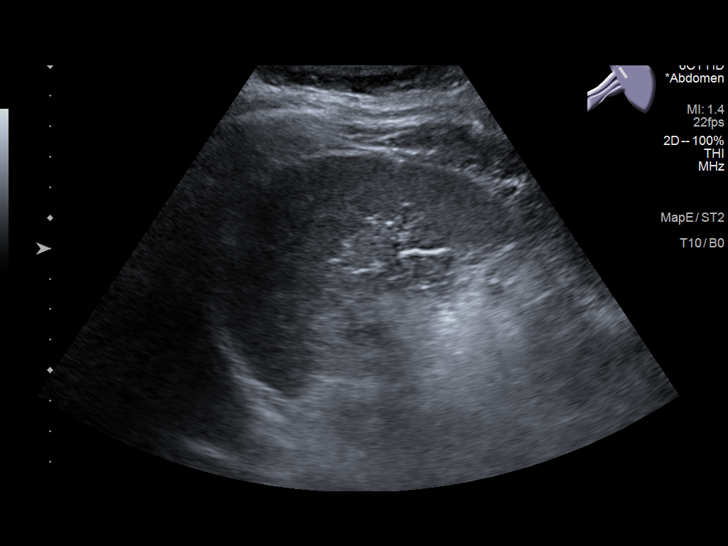
[im 57/105]
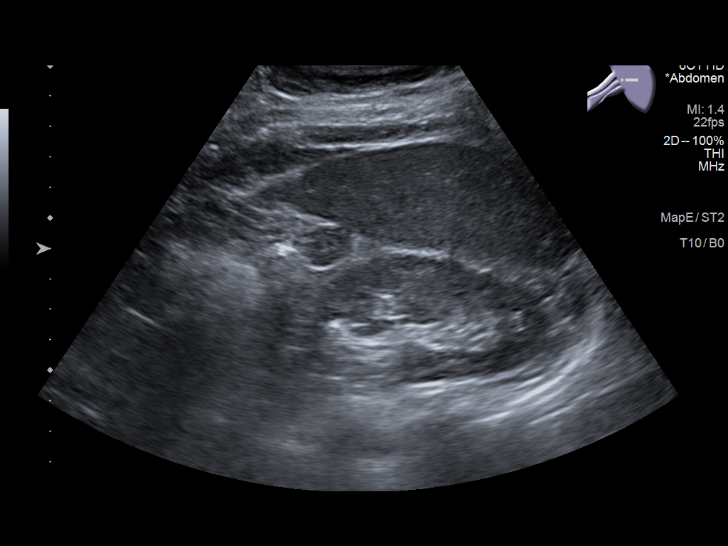
[im 66/105]
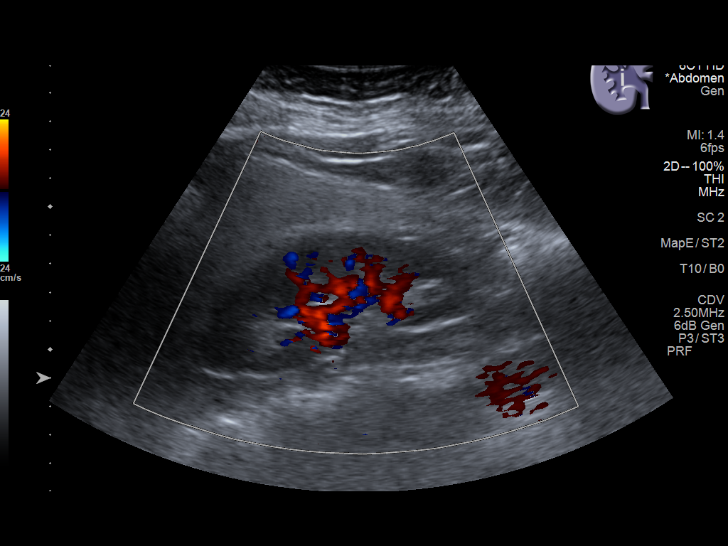
[im 70/105]
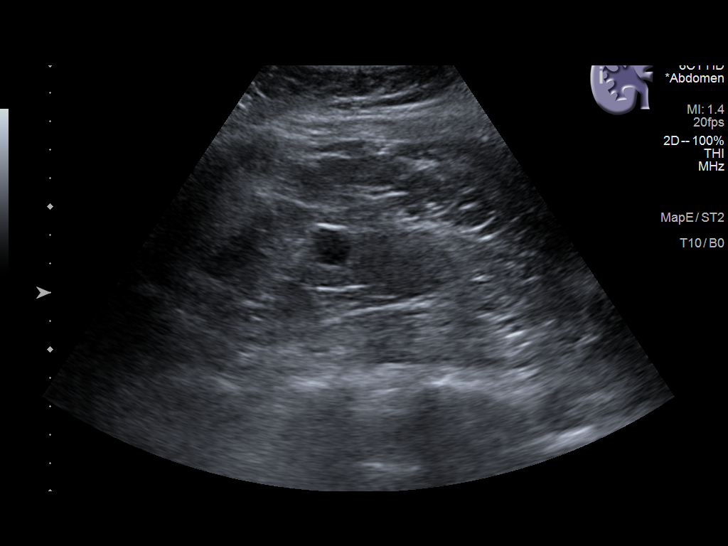
[im 79/105]
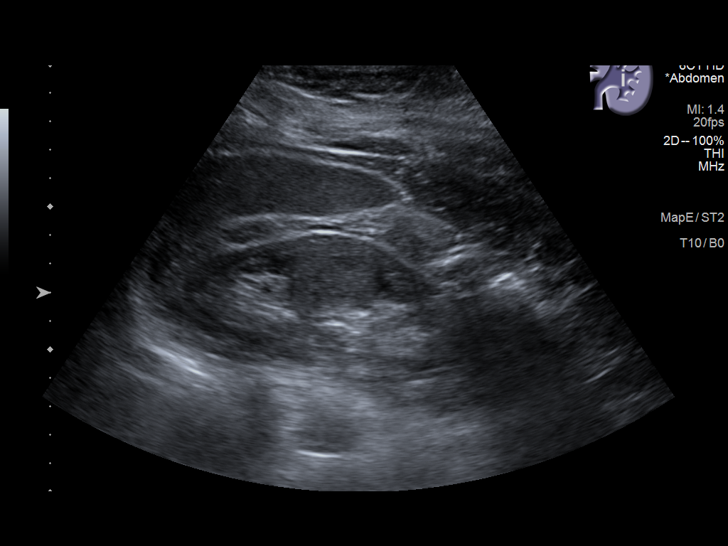
[im 87/105]
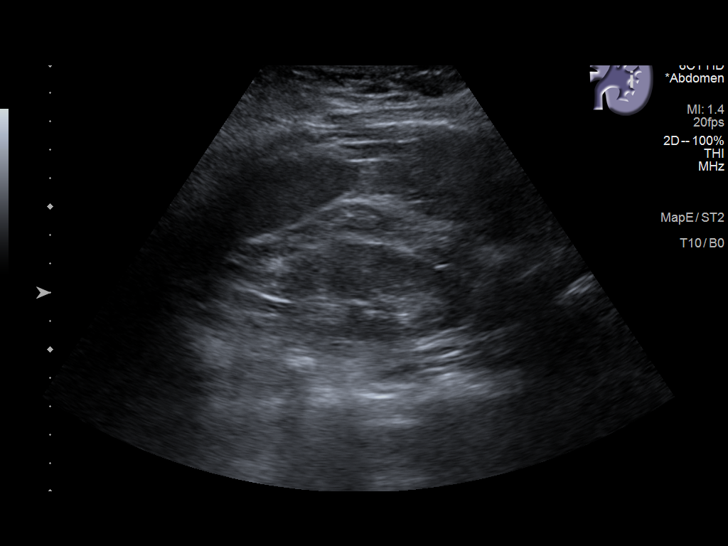
[im 96/105]
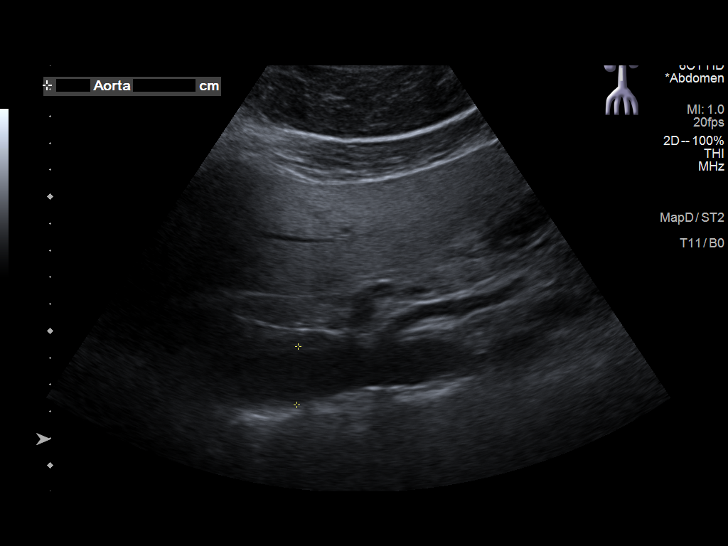
[im 105/105]
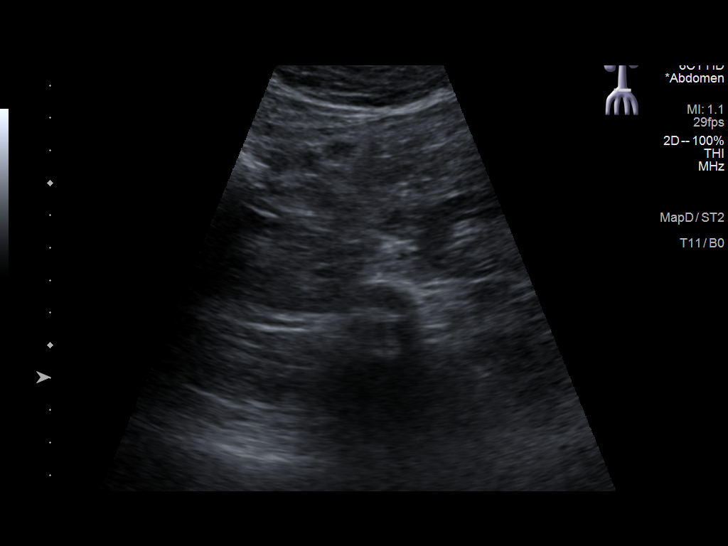

[14 of 25 positions shown; findings below may reference images not displayed]

FINDINGS: Gallbladder: Surgically absent.

Common bile duct: Diameter: 0.8 cm, within normal limits

Liver: No focal lesion identified. Diffusely increased liver
parenchymal echogenicity. Portal vein is patent on color Doppler
imaging with normal direction of blood flow towards the liver.

IVC: No abnormality visualized.

Pancreas: Visualized portion unremarkable.

Spleen: Size and appearance within normal limits. Accessory spleen
noted.

Right Kidney: Length: 10.6 cm. Echogenicity within normal limits. No
hydronephrosis. There is a lateral cyst measuring 1.4 cm.

Left Kidney: Length: 10.1 cm. Echogenicity within normal limits. No
hydronephrosis. There is a subcentimeter cyst in the superior pole.

Abdominal aorta: No aneurysm visualized.

Other findings: None.
IMPRESSION: 1. Diffusely increased liver parenchymal echogenicity which is
nonspecific but most commonly seen with hepatic steatosis.

2.  Small bilateral renal cysts.

## 2022-08-26 ENCOUNTER — Other Ambulatory Visit: Payer: Self-pay | Admitting: Internal Medicine

## 2022-08-26 DIAGNOSIS — Z1231 Encounter for screening mammogram for malignant neoplasm of breast: Secondary | ICD-10-CM

## 2022-10-22 ENCOUNTER — Ambulatory Visit
Admission: RE | Admit: 2022-10-22 | Discharge: 2022-10-22 | Disposition: A | Discharge status: Home / Self Care | Payer: 59 | Source: Ambulatory Visit | Attending: Internal Medicine | Admitting: Internal Medicine

## 2022-10-22 DIAGNOSIS — Z1231 Encounter for screening mammogram for malignant neoplasm of breast: Secondary | ICD-10-CM | POA: Diagnosis present

## 2023-08-16 DIAGNOSIS — Z1231 Encounter for screening mammogram for malignant neoplasm of breast: Secondary | ICD-10-CM | POA: Diagnosis not present

## 2023-08-16 DIAGNOSIS — Z1331 Encounter for screening for depression: Secondary | ICD-10-CM | POA: Diagnosis not present

## 2023-08-16 DIAGNOSIS — I10 Essential (primary) hypertension: Secondary | ICD-10-CM | POA: Diagnosis not present

## 2023-08-16 DIAGNOSIS — R739 Hyperglycemia, unspecified: Secondary | ICD-10-CM | POA: Diagnosis not present

## 2023-08-16 DIAGNOSIS — Z79899 Other long term (current) drug therapy: Secondary | ICD-10-CM | POA: Diagnosis not present

## 2023-08-16 DIAGNOSIS — E559 Vitamin D deficiency, unspecified: Secondary | ICD-10-CM | POA: Diagnosis not present

## 2023-08-16 DIAGNOSIS — E66811 Obesity, class 1: Secondary | ICD-10-CM | POA: Diagnosis not present

## 2023-08-16 DIAGNOSIS — E782 Mixed hyperlipidemia: Secondary | ICD-10-CM | POA: Diagnosis not present

## 2023-08-16 DIAGNOSIS — E6609 Other obesity due to excess calories: Secondary | ICD-10-CM | POA: Diagnosis not present

## 2023-08-16 DIAGNOSIS — Z0000 Encounter for general adult medical examination without abnormal findings: Secondary | ICD-10-CM | POA: Diagnosis not present

## 2023-08-16 DIAGNOSIS — F419 Anxiety disorder, unspecified: Secondary | ICD-10-CM | POA: Diagnosis not present

## 2023-08-18 ENCOUNTER — Other Ambulatory Visit: Payer: Self-pay | Admitting: Internal Medicine

## 2023-08-18 DIAGNOSIS — Z1231 Encounter for screening mammogram for malignant neoplasm of breast: Secondary | ICD-10-CM

## 2023-09-08 DIAGNOSIS — N1831 Chronic kidney disease, stage 3a: Secondary | ICD-10-CM | POA: Diagnosis not present

## 2023-09-08 DIAGNOSIS — E669 Obesity, unspecified: Secondary | ICD-10-CM | POA: Diagnosis not present

## 2023-09-08 DIAGNOSIS — R7303 Prediabetes: Secondary | ICD-10-CM | POA: Diagnosis not present

## 2023-09-08 DIAGNOSIS — F419 Anxiety disorder, unspecified: Secondary | ICD-10-CM | POA: Diagnosis not present

## 2023-09-08 DIAGNOSIS — Z6831 Body mass index (BMI) 31.0-31.9, adult: Secondary | ICD-10-CM | POA: Diagnosis not present

## 2023-09-08 DIAGNOSIS — I129 Hypertensive chronic kidney disease with stage 1 through stage 4 chronic kidney disease, or unspecified chronic kidney disease: Secondary | ICD-10-CM | POA: Diagnosis not present

## 2023-09-08 DIAGNOSIS — F17211 Nicotine dependence, cigarettes, in remission: Secondary | ICD-10-CM | POA: Diagnosis not present

## 2023-09-08 DIAGNOSIS — Z008 Encounter for other general examination: Secondary | ICD-10-CM | POA: Diagnosis not present

## 2023-09-19 DIAGNOSIS — Z79899 Other long term (current) drug therapy: Secondary | ICD-10-CM | POA: Diagnosis not present

## 2023-10-24 ENCOUNTER — Ambulatory Visit
Admission: RE | Admit: 2023-10-24 | Discharge: 2023-10-24 | Disposition: A | Discharge status: Home / Self Care | Source: Ambulatory Visit | Attending: Internal Medicine | Admitting: Internal Medicine

## 2023-10-24 DIAGNOSIS — Z1231 Encounter for screening mammogram for malignant neoplasm of breast: Secondary | ICD-10-CM | POA: Diagnosis not present

## 2023-11-15 ENCOUNTER — Emergency Department

## 2023-11-15 ENCOUNTER — Other Ambulatory Visit: Payer: Self-pay

## 2023-11-15 ENCOUNTER — Observation Stay
Admission: EM | Admit: 2023-11-15 | Discharge: 2023-11-16 | Disposition: A | Attending: Internal Medicine | Admitting: Internal Medicine

## 2023-11-15 DIAGNOSIS — I10 Essential (primary) hypertension: Secondary | ICD-10-CM | POA: Insufficient documentation

## 2023-11-15 DIAGNOSIS — R457 State of emotional shock and stress, unspecified: Secondary | ICD-10-CM | POA: Diagnosis not present

## 2023-11-15 DIAGNOSIS — E876 Hypokalemia: Secondary | ICD-10-CM | POA: Insufficient documentation

## 2023-11-15 DIAGNOSIS — Z87891 Personal history of nicotine dependence: Secondary | ICD-10-CM | POA: Insufficient documentation

## 2023-11-15 DIAGNOSIS — F10920 Alcohol use, unspecified with intoxication, uncomplicated: Secondary | ICD-10-CM | POA: Insufficient documentation

## 2023-11-15 DIAGNOSIS — R42 Dizziness and giddiness: Secondary | ICD-10-CM | POA: Diagnosis not present

## 2023-11-15 DIAGNOSIS — D693 Immune thrombocytopenic purpura: Secondary | ICD-10-CM | POA: Insufficient documentation

## 2023-11-15 DIAGNOSIS — I129 Hypertensive chronic kidney disease with stage 1 through stage 4 chronic kidney disease, or unspecified chronic kidney disease: Secondary | ICD-10-CM | POA: Diagnosis not present

## 2023-11-15 DIAGNOSIS — Z79899 Other long term (current) drug therapy: Secondary | ICD-10-CM | POA: Insufficient documentation

## 2023-11-15 DIAGNOSIS — F419 Anxiety disorder, unspecified: Secondary | ICD-10-CM | POA: Diagnosis not present

## 2023-11-15 DIAGNOSIS — M109 Gout, unspecified: Secondary | ICD-10-CM | POA: Insufficient documentation

## 2023-11-15 DIAGNOSIS — R112 Nausea with vomiting, unspecified: Secondary | ICD-10-CM | POA: Insufficient documentation

## 2023-11-15 DIAGNOSIS — R079 Chest pain, unspecified: Secondary | ICD-10-CM | POA: Diagnosis not present

## 2023-11-15 DIAGNOSIS — N182 Chronic kidney disease, stage 2 (mild): Secondary | ICD-10-CM | POA: Diagnosis not present

## 2023-11-15 DIAGNOSIS — D696 Thrombocytopenia, unspecified: Secondary | ICD-10-CM | POA: Diagnosis not present

## 2023-11-15 DIAGNOSIS — R0602 Shortness of breath: Secondary | ICD-10-CM | POA: Diagnosis not present

## 2023-11-15 DIAGNOSIS — R1111 Vomiting without nausea: Secondary | ICD-10-CM | POA: Diagnosis not present

## 2023-11-15 DIAGNOSIS — T50905A Adverse effect of unspecified drugs, medicaments and biological substances, initial encounter: Secondary | ICD-10-CM

## 2023-11-15 LAB — CBC WITH DIFFERENTIAL/PLATELET
Abs Immature Granulocytes: 0.02 K/uL (ref 0.00–0.07)
Basophils Absolute: 0 K/uL (ref 0.0–0.1)
Basophils Relative: 1 %
Eosinophils Absolute: 0.1 K/uL (ref 0.0–0.5)
Eosinophils Relative: 1 %
HCT: 40.8 % (ref 36.0–46.0)
Hemoglobin: 12.8 g/dL (ref 12.0–15.0)
Immature Granulocytes: 0 %
Lymphocytes Relative: 33 %
Lymphs Abs: 1.7 K/uL (ref 0.7–4.0)
MCH: 26.6 pg (ref 26.0–34.0)
MCHC: 31.4 g/dL (ref 30.0–36.0)
MCV: 84.6 fL (ref 80.0–100.0)
Monocytes Absolute: 0.2 K/uL (ref 0.1–1.0)
Monocytes Relative: 4 %
Neutro Abs: 3.2 K/uL (ref 1.7–7.7)
Neutrophils Relative %: 61 %
Platelets: 22 K/uL — CL (ref 150–400)
RBC: 4.82 MIL/uL (ref 3.87–5.11)
RDW: 14.1 % (ref 11.5–15.5)
Smear Review: NORMAL
WBC: 5.3 K/uL (ref 4.0–10.5)
nRBC: 0 % (ref 0.0–0.2)

## 2023-11-15 LAB — ACETAMINOPHEN LEVEL: Acetaminophen (Tylenol), Serum: <10 ug/mL — ABNORMAL LOW (ref 10–30)

## 2023-11-15 LAB — COMPREHENSIVE METABOLIC PANEL WITH GFR
ALT: 29 U/L (ref 0–44)
AST: 36 U/L (ref 15–41)
Albumin: 3.9 g/dL (ref 3.5–5.0)
Alkaline Phosphatase: 97 U/L (ref 38–126)
Anion gap: 18 — ABNORMAL HIGH (ref 5–15)
BUN: 17 mg/dL (ref 8–23)
CO2: 23 mmol/L (ref 22–32)
Calcium: 9.5 mg/dL (ref 8.9–10.3)
Chloride: 98 mmol/L (ref 98–111)
Creatinine, Ser: 1.07 mg/dL — ABNORMAL HIGH (ref 0.44–1.00)
GFR, Estimated: 58 mL/min — ABNORMAL LOW (ref >60)
Glucose, Bld: 163 mg/dL — ABNORMAL HIGH (ref 70–99)
Potassium: 3.5 mmol/L (ref 3.5–5.1)
Sodium: 139 mmol/L (ref 135–145)
Total Bilirubin: 0.7 mg/dL (ref 0.0–1.2)
Total Protein: 7.3 g/dL (ref 6.5–8.1)

## 2023-11-15 LAB — TECHNOLOGIST SMEAR REVIEW: Plt Morphology: NORMAL

## 2023-11-15 LAB — TROPONIN I (HIGH SENSITIVITY)
Troponin I (High Sensitivity): 3 ng/L (ref <18)
Troponin I (High Sensitivity): 5 ng/L (ref <18)

## 2023-11-15 LAB — SALICYLATE LEVEL: Salicylate Lvl: <7 mg/dL — ABNORMAL LOW (ref 7.0–30.0)

## 2023-11-15 LAB — PROTIME-INR
INR: 1.1 (ref 0.8–1.2)
Prothrombin Time: 14.5 s (ref 11.4–15.2)

## 2023-11-15 LAB — FOLATE: Folate: 7.5 ng/mL (ref >5.9)

## 2023-11-15 LAB — D-DIMER, QUANTITATIVE: D-Dimer, Quant: 0.34 ug{FEU}/mL (ref 0.00–0.50)

## 2023-11-15 LAB — IMMATURE PLATELET FRACTION: Immature Platelet Fraction: 13.6 % — ABNORMAL HIGH (ref 1.2–8.6)

## 2023-11-15 LAB — APTT: aPTT: 26 s (ref 24–36)

## 2023-11-15 LAB — LACTATE DEHYDROGENASE: LDH: 149 U/L (ref 98–192)

## 2023-11-15 MED ORDER — ONDANSETRON HCL 4 MG/2ML IJ SOLN: 4.0000 mg | Freq: Four times a day (QID) | INTRAMUSCULAR | Status: DC | PRN

## 2023-11-15 MED ORDER — ACETAMINOPHEN 650 MG RE SUPP: 650.0000 mg | Freq: Four times a day (QID) | RECTAL | Status: DC | PRN

## 2023-11-15 MED ORDER — ACETAMINOPHEN 325 MG PO TABS: 650.0000 mg | ORAL_TABLET | Freq: Four times a day (QID) | ORAL | Status: DC | PRN

## 2023-11-15 MED ORDER — LORAZEPAM 2 MG/ML IJ SOLN
0.5000 mg | Freq: Once | INTRAMUSCULAR | Status: AC
  Administered 2023-11-15: 0.5 mg via INTRAVENOUS
  Filled 2023-11-15: qty 1

## 2023-11-15 MED ORDER — ONDANSETRON HCL 4 MG PO TABS: 4.0000 mg | ORAL_TABLET | Freq: Four times a day (QID) | ORAL | Status: DC | PRN

## 2023-11-15 MED ORDER — SODIUM CHLORIDE 0.9 % IV SOLN: 12.5000 mg | Freq: Four times a day (QID) | INTRAVENOUS | Status: DC | PRN

## 2023-11-15 MED ORDER — SODIUM CHLORIDE 0.9 % IV BOLUS
1000.0000 mL | Freq: Once | INTRAVENOUS | Status: AC
  Administered 2023-11-15: 1000 mL via INTRAVENOUS

## 2023-11-15 MED ORDER — HYDROCODONE-ACETAMINOPHEN 5-325 MG PO TABS: 1.0000 | ORAL_TABLET | ORAL | Status: DC | PRN

## 2023-11-15 NOTE — Assessment & Plan Note (Signed)
 No acute issues.

## 2023-11-15 NOTE — ED Notes (Signed)
 PT screaming for help while waiting to be triaged. Pt called this RN a Research officer, political party. Pt made aware that she cannot speak to staff that way.

## 2023-11-15 NOTE — ED Provider Notes (Signed)
 [facility] Provider Note    Event Date/Time   First MD Initiated Contact with Patient 11/15/23 1607     (approximate)   History   THC gummie   HPI  Stephanie Howard is a 63 y.o. female who comes in with concerns for vomiting after taking a THC gummy.  Patient took a THC gummy around 1230.  She got 8 of Zofran 600 of fluids and 5 of Haldol.  Patient reports that she just got back from [place] yesterday.  She reports feeling fine this morning and going about her normal routine.  Denies drinking any alcohol today but around 1230 she did take a gummy that she got from [place] and then afterwards started having significant nausea, vomiting.  Denies any abdominal pain.  She has not taken anything like this before.  No known fevers.     Physical Exam   Triage Vital Signs: ED Triage Vitals  Encounter Vitals Group     BP 11/15/23 1557 131/68     Girls Systolic BP Percentile --      Girls Diastolic BP Percentile --      Boys Systolic BP Percentile --      Boys Diastolic BP Percentile --      Pulse Rate 11/15/23 1557 (!) 110     Resp 11/15/23 1557 19     Temp 11/15/23 1557 97.6 F (36.4 C)     Temp src --      SpO2 11/15/23 1557 100 %     Weight --      Height --      Head Circumference --      Peak Flow --      Pain Score 11/15/23 1552 0     Pain Loc --      Pain Education --      Exclude from Growth Chart --     Most recent vital signs: Vitals:   11/15/23 1557  BP: 131/68  Pulse: (!) 110  Resp: 19  Temp: 97.6 F (36.4 C)  SpO2: 100%     General: Awake, no distress.  CV:  Good peripheral perfusion.  Resp:  Normal effort.  Abd:  No distention.  Soft and nontender Other:  Equal strength in arms and legs.  No cranial nerve deficits   ED Results / Procedures / Treatments   Labs (all labs ordered are listed, but only abnormal results are displayed) Labs Reviewed  CBC WITH DIFFERENTIAL/PLATELET - Abnormal; Notable for the  following components:      Result Value   Platelets 22 (*)    All other components within normal limits  COMPREHENSIVE METABOLIC PANEL WITH GFR - Abnormal; Notable for the following components:   Glucose, Bld 163 (*)    Creatinine, Ser 1.07 (*)    GFR, Estimated 58 (*)    Anion gap 18 (*)    All other components within normal limits  SALICYLATE LEVEL  ACETAMINOPHEN LEVEL     EKG  My interpretation of EKG:  Sinus tachycardia rate of 114 without any ST elevation, some T wave ST depression in lead III, QTc of 495  RADIOLOGY I have reviewed the CT head and personally no ICH    PROCEDURES:  Critical Care performed:   .1-3 Lead EKG Interpretation  Performed by: Ernest Ronal BRAVO, MD Authorized by: Ernest Ronal BRAVO, MD     Interpretation: normal     ECG rate:  90   ECG rate assessment:  normal     Rhythm: sinus rhythm     Ectopy: none     Conduction: normal      MEDICATIONS ORDERED IN ED: Medications  sodium chloride 0.9 % bolus 1,000 mL (1,000 mLs Intravenous New Bag/Given 11/15/23 1645)  LORazepam (ATIVAN) injection 0.5 mg (0.5 mg Intravenous Given 11/15/23 1642)     IMPRESSION / MDM / ASSESSMENT AND PLAN / ED COURSE  I reviewed the triage vital signs and the nursing notes.   Patient's presentation is most consistent with acute presentation with potential threat to life or bodily function.   Patient comes in with concerns for substance induced nausea, vomiting.  Was fine until taking a gummy that she got in [place]  Workup was done to evaluate for dehydration, Electra abnormalities, AKI.  She denies any Co. substances.\  CBC shows a low platelet of 22.  CMP shows slightly elevated creatinine at 1.07 with slightly anion gap I suspect related to the vomiting.  There is some concern that patient's face looked a little droopy and the nurse did a NIH stroke scale was 0.  I also reevaluated patient myself and stroke scale was 0 and family then reported that it looked  closer back to baseline.  However given her low platelets I will get CT imaging of her head to ensure no evidence of intercranial hemorrhage.  Discussed with with Dr. Melanee from oncology.  Additional blood work was ordered to evaluate for this.  Patient has what is concerning for ITP.  She did recommend admission to the hospital for IVIG that will be given tomorrow as well as IV steroids.  I will discuss with the hospital team for admission.   The patient is on the cardiac monitor to evaluate for evidence of arrhythmia and/or significant heart rate changes.      FINAL CLINICAL IMPRESSION(S) / ED DIAGNOSES   Final diagnoses:  Thrombocytopenia  Acute ITP (HCC)  Adverse effect of drug, initial encounter     Rx / DC Orders   ED Discharge Orders     None        Note:  This document was prepared using Dragon voice recognition software and may include unintentional dictation errors.   Ernest Ronal BRAVO, MD 11/15/23 2225

## 2023-11-15 NOTE — Assessment & Plan Note (Signed)
-   Continue home meds

## 2023-11-15 NOTE — H&P (Signed)
 History and Physical    Patient: Stephanie Howard FMW:[id number] DOB: Aug 14, 1960 DOA: 11/15/2023 DOS: the patient was seen and examined on 11/15/2023 PCP: Auston Reyes BIRCH, MD  Patient coming from: Home  Chief Complaint:  Chief Complaint  Patient presents with   [facility] gummie    HPI: Stephanie Howard is a 63 y.o. female with medical history significant for HTN, gout, anxiety being admitted for thrombocytopenia seen as an incidental finding during workup for acute nausea and vomiting with lightheadedness that started following chewing a THC gummy for the first time.  She was previously in her usual state of health and denies fever or chills.  Denies bruising or bleeding. In the ED mildly tachycardic to 110 with otherwise normal vitals Labs showing platelet count of 22,000 (3 29,000 and July 2025.) D-dimer and LDH normal Troponin, INR unremarkable.  CMP showing elevated anion gap of 18 without acidosis or hyperglycemia.  Creatinine at baseline at 1.04 EKG with sinus tachycardia at 114 CT head nonacute Chest x-ray pending The ED provider spoke with hematologist, Dr. Melanee who suspects ITP and plans to start IVIG and Decadron in the AM. Patient treated with Zofran and Haldol for vomiting in the ED Admission requested     Review of Systems: As mentioned in the history of present illness. All other systems reviewed and are negative.  Past Medical History:  Diagnosis Date   Hypertension    Past Surgical History:  Procedure Laterality Date   BREAST CYST ASPIRATION Left    Social History:  reports that she has quit smoking. Her smoking use included cigarettes. She has never used smokeless tobacco. She reports current alcohol use. She reports current drug use.  Allergies  Allergen Reactions   Hydrocodone Hives    Pt states makes her itch 10 years ago   Losartan Other (See Comments)    Facial swellings    Family History  Problem Relation Age of Onset   Ovarian cancer Mother 52    Breast cancer Neg Hx     Prior to Admission medications   Medication Sig Start Date End Date Taking? Authorizing Provider  allopurinol (ZYLOPRIM) 100 MG tablet Take 200 mg by mouth daily. 09/21/21 09/21/22  [provider]  fluticasone (FLONASE) 50 MCG/ACT nasal spray Place 2 sprays into both nostrils daily.    [provider]  omeprazole (PRILOSEC) 40 MG capsule Take 40 mg by mouth daily.    [provider]  potassium chloride  SA (KLOR-CON  M) 20 MEQ tablet Take 1 tablet (20 mEq total) by mouth daily. 12/10/21   Darliss Rogue, MD  triamterene-hydrochlorothiazide (DYAZIDE) 37.5-25 MG capsule Take 1 capsule by mouth daily. 11/17/21   [provider]  Vitamin D, Ergocalciferol, (DRISDOL) 1.25 MG (50000 UNIT) CAPS capsule Take 50,000 Units by mouth every 7 (seven) days. 11/21/21   [provider]    Physical Exam: Vitals:   11/15/23 1800 11/15/23 1830 11/15/23 2002 11/15/23 2020  BP: 128/74 128/69    Pulse: (!) 113 (!) 105  95  Resp: 20 15  18   Temp:   97.8 F (36.6 C)   TempSrc:   Oral   SpO2: 97% 96%  98%   Physical Exam  Labs on Admission: I have personally reviewed following labs and imaging studies  CBC: Recent Labs  Lab 11/15/23 1628  WBC 5.3  NEUTROABS 3.2  HGB 12.8  HCT 40.8  MCV 84.6  PLT 22*   Basic Metabolic Panel: Recent Labs  Lab 11/15/23 1628  NA 139  K 3.5  CL 98  CO2 23  GLUCOSE 163*  BUN 17  CREATININE 1.07*  CALCIUM 9.5   GFR: CrCl cannot be calculated (Unknown ideal weight.). Liver Function Tests: Recent Labs  Lab 11/15/23 1628  AST 36  ALT 29  ALKPHOS 97  BILITOT 0.7  PROT 7.3  ALBUMIN 3.9   No results for input(s): LIPASE, AMYLASE in the last 168 hours. No results for input(s): AMMONIA in the last 168 hours. Coagulation Profile: Recent Labs  Lab 11/15/23 2013  INR 1.1   Cardiac Enzymes: No results for input(s): CKTOTAL, CKMB, CKMBINDEX, TROPONINI in the last 168  hours. BNP (last 3 results) No results for input(s): PROBNP in the last 8760 hours. HbA1C: No results for input(s): HGBA1C in the last 72 hours. CBG: No results for input(s): GLUCAP in the last 168 hours. Lipid Profile: No results for input(s): CHOL, HDL, LDLCALC, TRIG, CHOLHDL, LDLDIRECT in the last 72 hours. Thyroid  Function Tests: No results for input(s): TSH, T4TOTAL, FREET4, T3FREE, THYROIDAB in the last 72 hours. Anemia Panel: Recent Labs    11/15/23 2013  FOLATE 7.5   Urine analysis: No results found for: COLORURINE, APPEARANCEUR, LABSPEC, PHURINE, GLUCOSEU, HGBUR, BILIRUBINUR, KETONESUR, PROTEINUR, UROBILINOGEN, NITRITE, LEUKOCYTESUR  Radiological Exams on Admission: CT HEAD WO CONTRAST ( ) Result Date: 11/15/2023 EXAM: CT HEAD WITHOUT CONTRAST 11/15/2023 07:42:14 PM TECHNIQUE: CT of the head was performed without the administration of intravenous contrast. Automated exposure control, iterative reconstruction, and/or weight based adjustment of the mA/kV was utilized to reduce the radiation dose to as low as reasonably achievable. COMPARISON: 12/11/2013 CLINICAL HISTORY: Headache, new onset (Age >= 51y). Pt comes via EMS from home with c/o vomiting after taking a THC gummy. Pt states she took the gummy at 1230. EMS gave zofran, fluids started and 5mg  of haldol. Pt has 18g in place. Pt currently resting with eyes closed. FINDINGS: BRAIN AND VENTRICLES: Intracranial atherosclerosis. No acute hemorrhage. No evidence of acute infarct. No hydrocephalus. No extra-axial collection. No mass effect or midline shift. ORBITS: No acute abnormality. SINUSES: No acute abnormality. SOFT TISSUES AND SKULL: No acute soft tissue abnormality. No skull fracture. IMPRESSION: 1. No acute intracranial abnormality. Electronically signed by: Pinkie Pebbles MD 11/15/2023 07:48 PM EDT RP Workstation: [id number]   Data Reviewed for HPI: Relevant notes from  primary care and specialist visits, past discharge summaries as available in EHR, including Care Everywhere. Prior diagnostic testing as pertinent to current admission diagnoses Updated medications and problem lists for reconciliation ED course, including vitals, labs, imaging, treatment and response to treatment Triage notes, nursing and pharmacy notes and ED provider's notes Notable results as noted above in HPI      Assessment and Plan: * Thrombocytopenia Possible ITP  History of recent travel Per hematology consult from the ED with Dr Melanee : --Ldh is normal. IPF is elevated. This is likely ITP. ...we can give her ivig tomorrrow and day after ...and start her on decadron 40 mg for 4 days starting tomorrow. And discharge her on Thursday after second dose of ivig Formal consult to hematology placed Will get repeat CBC in the a.m.  Nausea and vomiting Lightheadedness Suspect related to THC gummy CT head nonacute Antiemetics and supportive care  Hypertension Continue home meds  Anxiety Continue home meds  Gout No acute issues        DVT prophylaxis: SCD  Consults: hematology  Advance Care Planning: full code  Family Communication: none***  Disposition Plan: Back to previous home  environment  Severity of Illness: The appropriate patient status for this patient is OBSERVATION. Observation status is judged to be reasonable and necessary in order to provide the required intensity of service to ensure the patient's safety. The patient's presenting symptoms, physical exam findings, and initial radiographic and laboratory data in the context of their medical condition is felt to place them at decreased risk for further clinical deterioration. Furthermore, it is anticipated that the patient will be medically stable for discharge from the hospital within 2 midnights of admission.   Author: Delayne LULLA Solian, MD 11/15/2023 10:56 PM  For on call review [email or web address].

## 2023-11-15 NOTE — ED Triage Notes (Signed)
 First Nurse Note: Patient to ED via [facility] from home for vomiting after taking a THC gummy. Took the gummy around 1230. Given 8mg  zofran, 600 mL LR, 5mg  Haldol. 18 R forearm  130 HR 169/79 95% RA

## 2023-11-15 NOTE — ED Notes (Signed)
 Pt disconnected from monitor to use toilet in room. Pt back in bed with call light in reach.

## 2023-11-15 NOTE — Assessment & Plan Note (Addendum)
 Lightheadedness Suspect related to THC gummy CT head nonacute Antiemetics and supportive care

## 2023-11-15 NOTE — ED Notes (Signed)
 Reported critical lab value of 22 for platelet count to MD

## 2023-11-15 NOTE — ED Triage Notes (Signed)
 Pt comes via EMS from home with c/o vomiting after taking a THC gummy. Pt states she took the gummy at 1230. EMS gave zofran, fluids started and 5mg  of haldol. Pt has 18g in place. Pt currently resting with eyes closed.

## 2023-11-15 NOTE — ED Notes (Signed)
 Pt family member called out to report that they believed pt had a facial droop. This RN completed an NIH on pt at this time. No facial droop noted. NIH score 0. Ernest, MD notified.

## 2023-11-15 NOTE — Assessment & Plan Note (Addendum)
 Possible ITP  History of recent travel Per hematology consult from the ED with Dr Melanee : --Ldh is normal. IPF is elevated. This is likely ITP. ...we can give her ivig tomorrrow and day after ...and start her on decadron 40 mg for 4 days starting tomorrow. And discharge her on Thursday after second dose of ivig Formal consult to hematology placed Will get repeat CBC in the a.m.

## 2023-11-15 NOTE — ED Notes (Signed)
 Provided 2 cups of water, TV remote and adjusted pt's bed. Pt states no needs at this time, bed low and locked and call bell in reach.

## 2023-11-16 DIAGNOSIS — I10 Essential (primary) hypertension: Secondary | ICD-10-CM | POA: Diagnosis not present

## 2023-11-16 DIAGNOSIS — R112 Nausea with vomiting, unspecified: Secondary | ICD-10-CM | POA: Diagnosis not present

## 2023-11-16 DIAGNOSIS — D696 Thrombocytopenia, unspecified: Secondary | ICD-10-CM | POA: Diagnosis not present

## 2023-11-16 LAB — CBC
HCT: 36.4 % (ref 36.0–46.0)
HCT: 36.2 % (ref 36.0–46.0)
Hemoglobin: 11.5 g/dL — ABNORMAL LOW (ref 12.0–15.0)
Hemoglobin: 11.6 g/dL — ABNORMAL LOW (ref 12.0–15.0)
MCH: 26.3 pg (ref 26.0–34.0)
MCH: 26.2 pg (ref 26.0–34.0)
MCHC: 31.6 g/dL (ref 30.0–36.0)
MCHC: 32 g/dL (ref 30.0–36.0)
MCV: 83.3 fL (ref 80.0–100.0)
MCV: 81.7 fL (ref 80.0–100.0)
Platelets: 255 K/uL (ref 150–400)
Platelets: 271 K/uL (ref 150–400)
RBC: 4.37 MIL/uL (ref 3.87–5.11)
RBC: 4.43 MIL/uL (ref 3.87–5.11)
RDW: 14.3 % (ref 11.5–15.5)
RDW: 14.3 % (ref 11.5–15.5)
WBC: 8.3 K/uL (ref 4.0–10.5)
WBC: 8 K/uL (ref 4.0–10.5)
nRBC: 0 % (ref 0.0–0.2)
nRBC: 0 % (ref 0.0–0.2)

## 2023-11-16 LAB — COMPREHENSIVE METABOLIC PANEL WITH GFR
ALT: 22 U/L (ref 0–44)
AST: 20 U/L (ref 15–41)
Albumin: 3.2 g/dL — ABNORMAL LOW (ref 3.5–5.0)
Alkaline Phosphatase: 66 U/L (ref 38–126)
Anion gap: 9 (ref 5–15)
BUN: 12 mg/dL (ref 8–23)
CO2: 25 mmol/L (ref 22–32)
Calcium: 8.8 mg/dL — ABNORMAL LOW (ref 8.9–10.3)
Chloride: 106 mmol/L (ref 98–111)
Creatinine, Ser: 1.03 mg/dL — ABNORMAL HIGH (ref 0.44–1.00)
GFR, Estimated: >60 mL/min (ref >60)
Glucose, Bld: 102 mg/dL — ABNORMAL HIGH (ref 70–99)
Potassium: 3.4 mmol/L — ABNORMAL LOW (ref 3.5–5.1)
Sodium: 140 mmol/L (ref 135–145)
Total Bilirubin: 0.5 mg/dL (ref 0.0–1.2)
Total Protein: 6.3 g/dL — ABNORMAL LOW (ref 6.5–8.1)

## 2023-11-16 LAB — VITAMIN B12: Vitamin B-12: 656 pg/mL (ref 180–914)

## 2023-11-16 LAB — HIV ANTIBODY (ROUTINE TESTING W REFLEX): HIV Screen 4th Generation wRfx: NONREACTIVE

## 2023-11-16 LAB — HEPATITIS C ANTIBODY: HCV Ab: NONREACTIVE

## 2023-11-16 MED ORDER — POTASSIUM CHLORIDE 20 MEQ PO PACK
40.0000 meq | PACK | ORAL | Status: AC
  Administered 2023-11-16 (×2): 40 meq via ORAL
  Filled 2023-11-16 (×2): qty 2

## 2023-11-16 NOTE — Care Management Obs Status (Signed)
 MEDICARE OBSERVATION STATUS NOTIFICATION   Patient Details  Name: ISABEAU MCCALLA MRN: [id number] Date of Birth: 06-11-1960   Medicare Observation Status Notification Given:  Yes    Rojelio SHAUNNA Rattler 11/16/2023, 10:40 AM

## 2023-11-16 NOTE — Discharge Summary (Addendum)
 Physician Discharge Summary   Patient: Stephanie Howard MRN: [id number] DOB: 18-Aug-1960  Admit date:     11/15/2023  Discharge date: 11/16/23  Discharge Physician: Murvin Mana   PCP: Auston Reyes BIRCH, MD   Recommendations at discharge:   With PCP in 1 week.  Discharge Diagnoses: Principal Problem:   Thrombocytopenia Active Problems:   Nausea and vomiting   Gout   Anxiety   Hypertension Hypokalemia. Chronic kidney disease stage II Resolved Problems:   * No resolved hospital problems. *  Hospital Course: Stephanie Howard is a 63 y.o. female with medical history significant for HTN, gout, anxiety being admitted for thrombocytopenia seen as an incidental finding during workup for acute nausea and vomiting with lightheadedness that started following chewing a THC gummy for the first time. Lab work, repeated CBC showed platelets 255, repeat again 271.  The initial thrombocytopenia appeared to be a lab error.  Assessment and Plan: * Thrombocytopenia rule out Initial platelet count was 21, repeated CBC today, 2 with normal platelet count.  Initial test appeared to be lab error.  Nausea and vomiting Lightheadedness Suspect related to THC gummy CT head nonacute Symptom has resolved.  Hypertension Continue home meds  Hypokalemia Potassium 3.4, given 80 mEq of oral KCl  Anxiety Continue home meds  Gout No acute issues        Consultants: None Procedures performed: None  Disposition: Home Diet recommendation:  Discharge Diet Orders (From admission, onward)     Start     Ordered   11/16/23 0000  Diet - low sodium heart healthy        11/16/23 1134           Cardiac diet DISCHARGE MEDICATION: Allergies as of 11/16/2023       Reactions   Hydrocodone Hives   Pt states makes her itch 10 years ago   Losartan Other (See Comments)   Facial swellings        Medication List     TAKE these medications    allopurinol 100 MG tablet Commonly known as:  ZYLOPRIM Take 200 mg by mouth daily.   fluticasone 50 MCG/ACT nasal spray Commonly known as: FLONASE Place 2 sprays into both nostrils daily.   omeprazole 40 MG capsule Commonly known as: PRILOSEC Take 40 mg by mouth daily.   potassium chloride  SA 20 MEQ tablet Commonly known as: KLOR-CON  M Take 1 tablet (20 mEq total) by mouth daily.   triamterene-hydrochlorothiazide 37.5-25 MG capsule Commonly known as: DYAZIDE Take 1 capsule by mouth daily.   Vitamin D (Ergocalciferol) 1.25 MG (50000 UNIT) Caps capsule Commonly known as: DRISDOL Take 50,000 Units by mouth every 7 (seven) days.        Follow-up Information     Auston Reyes BIRCH, MD Follow up in 1 week(s).   Specialty: Internal Medicine Contact information: [address] [place] [address] [phone]                Discharge Exam: Fredricka Weights   11/16/23 0045  Weight: 81.5 kg   General exam: Appears calm and comfortable  Respiratory system: Clear to auscultation. Respiratory effort normal. Cardiovascular system: S1 & S2 heard, RRR. No JVD, murmurs, rubs, gallops or clicks. No pedal edema. Gastrointestinal system: Abdomen is nondistended, soft and nontender. No organomegaly or masses felt. Normal bowel sounds heard. Central nervous system: Alert and oriented. No focal neurological deficits. Extremities: Symmetric 5 x 5 power. Skin: No rashes, lesions or ulcers Psychiatry: Judgement and  insight appear normal. Mood & affect appropriate.    Condition at discharge: good  The results of significant diagnostics from this hospitalization (including imaging, microbiology, ancillary and laboratory) are listed below for reference.   Imaging Studies: DG Chest Portable 1 View Result Date: 11/15/2023 EXAM: 1 VIEW(S) XRAY OF THE CHEST 11/15/2023 10:58:00 PM COMPARISON: 10/07/2006 CLINICAL HISTORY: sob. Pt comes via EMS from home with c/o vomiting after taking a THC gummy. Pt states she took the gummy at  1230. EMS gave zofran, fluids started and 5mg  of haldol. FINDINGS: LUNGS AND PLEURA: No focal pulmonary opacity. No pulmonary edema. No pleural effusion. No pneumothorax. HEART AND MEDIASTINUM: No acute abnormality of the cardiac and mediastinal silhouettes. BONES AND SOFT TISSUES: No acute osseous abnormality. IMPRESSION: 1. No acute cardiopulmonary process. Electronically signed by: Pinkie Pebbles MD 11/15/2023 11:05 PM EDT RP Workstation: [id number]   CT HEAD WO CONTRAST ( ) Result Date: 11/15/2023 EXAM: CT HEAD WITHOUT CONTRAST 11/15/2023 07:42:14 PM TECHNIQUE: CT of the head was performed without the administration of intravenous contrast. Automated exposure control, iterative reconstruction, and/or weight based adjustment of the mA/kV was utilized to reduce the radiation dose to as low as reasonably achievable. COMPARISON: 12/11/2013 CLINICAL HISTORY: Headache, new onset (Age >= 51y). Pt comes via EMS from home with c/o vomiting after taking a THC gummy. Pt states she took the gummy at 1230. EMS gave zofran, fluids started and 5mg  of haldol. Pt has 18g in place. Pt currently resting with eyes closed. FINDINGS: BRAIN AND VENTRICLES: Intracranial atherosclerosis. No acute hemorrhage. No evidence of acute infarct. No hydrocephalus. No extra-axial collection. No mass effect or midline shift. ORBITS: No acute abnormality. SINUSES: No acute abnormality. SOFT TISSUES AND SKULL: No acute soft tissue abnormality. No skull fracture. IMPRESSION: 1. No acute intracranial abnormality. Electronically signed by: Pinkie Pebbles MD 11/15/2023 07:48 PM EDT RP Workstation: [id number]   MM 3D SCREENING MAMMOGRAM BILATERAL BREAST Result Date: 10/25/2023 CLINICAL DATA:  Screening. EXAM: DIGITAL SCREENING BILATERAL MAMMOGRAM WITH TOMOSYNTHESIS AND CAD TECHNIQUE: Bilateral screening digital craniocaudal and mediolateral oblique mammograms were obtained. Bilateral screening digital breast tomosynthesis was performed. The  images were evaluated with computer-aided detection. COMPARISON:  Previous exam(s). ACR Breast Density Category b: There are scattered areas of fibroglandular density. FINDINGS: There are no findings suspicious for malignancy. IMPRESSION: No mammographic evidence of malignancy. A result letter of this screening mammogram will be mailed directly to the patient. RECOMMENDATION: Screening mammogram in one year. (Code:SM-B-01Y) BI-RADS CATEGORY  1: Negative. Electronically Signed   By: Reyes Phi M.D.   On: 10/25/2023 15:57    Microbiology: No results found for this or any previous visit.  Labs: CBC: Recent Labs  Lab 11/15/23 1628 11/16/23 0538 11/16/23 1048  WBC 5.3 8.3 8.0  NEUTROABS 3.2  --   --   HGB 12.8 11.5* 11.6*  HCT 40.8 36.4 36.2  MCV 84.6 83.3 81.7  PLT 22* 255 271   Basic Metabolic Panel: Recent Labs  Lab 11/15/23 1628 11/16/23 0538  NA 139 140  K 3.5 3.4*  CL 98 106  CO2 23 25  GLUCOSE 163* 102*  BUN 17 12  CREATININE 1.07* 1.03*  CALCIUM 9.5 8.8*   Liver Function Tests: Recent Labs  Lab 11/15/23 1628 11/16/23 0538  AST 36 20  ALT 29 22  ALKPHOS 97 66  BILITOT 0.7 0.5  PROT 7.3 6.3*  ALBUMIN 3.9 3.2*   CBG: No results for input(s): GLUCAP in the last 168 hours.  Discharge time spent: 52  minutes.  Signed: Murvin Mana, MD [facility] 11/16/2023

## 2023-11-16 NOTE — TOC CM/SW Note (Signed)
 Transition of Care [facility]) CM/SW Note    Transition of Care [facility]) - Inpatient Brief Assessment   Patient Details  Name: Stephanie Howard MRN: [id number] Date of Birth: 13-Jul-1960  Transition of Care [facility]) CM/SW Contact:    Alfonso Rummer, LCSW Phone Number: 11/16/2023, 9:31 AM   Clinical Narrative:  CSW A Abron Neddo completed TOC chart review. No current needs identified please contact should TOC needs arise.   Transition of Care Asessment: Insurance and Status: Insurance coverage has been reviewed Administrator, [employer]) Patient has primary care physician: Yes (SPARKS, JEFFREY D) Home environment has been reviewed: Single family home   Prior/Current Home Services: No current home services Social Drivers of Health Review: SDOH reviewed no interventions necessary Readmission risk has been reviewed: No Transition of care needs: no transition of care needs at this time

## 2023-12-06 ENCOUNTER — Ambulatory Visit (LOCAL_COMMUNITY_HEALTH_CENTER): Payer: Self-pay

## 2023-12-06 DIAGNOSIS — Z111 Encounter for screening for respiratory tuberculosis: Secondary | ICD-10-CM

## 2023-12-09 ENCOUNTER — Ambulatory Visit (LOCAL_COMMUNITY_HEALTH_CENTER): Payer: Self-pay

## 2023-12-09 DIAGNOSIS — Z111 Encounter for screening for respiratory tuberculosis: Secondary | ICD-10-CM

## 2023-12-09 LAB — TB SKIN TEST
Induration: 0 mm
TB Skin Test: NEGATIVE
